# Patient Record
Sex: Male | Born: 1970 | Race: White | Hispanic: No | Marital: Married | Smoking: Never smoker
Health system: Southern US, Community
[De-identification: ages and names within clinical notes are randomized; demographics above are authoritative.]

---

## 1998-07-09 ENCOUNTER — Emergency Department (HOSPITAL_COMMUNITY): Admission: EM | Admit: 1998-07-09 | Discharge: 1998-07-10 | Payer: Self-pay | Admitting: Emergency Medicine

## 1998-07-13 ENCOUNTER — Encounter: Admission: RE | Admit: 1998-07-13 | Discharge: 1998-10-11 | Payer: Self-pay | Admitting: Internal Medicine

## 2000-11-14 ENCOUNTER — Encounter: Admission: RE | Admit: 2000-11-14 | Discharge: 2000-11-14 | Payer: Self-pay | Admitting: Occupational Medicine

## 2000-11-14 ENCOUNTER — Encounter: Payer: Self-pay | Admitting: Occupational Medicine

## 2020-10-22 ENCOUNTER — Inpatient Hospital Stay (HOSPITAL_COMMUNITY)
Admission: EM | Admit: 2020-10-22 | Discharge: 2020-10-26 | DRG: 177 | Disposition: A | Discharge status: Home / Self Care | Payer: 59 | Attending: Internal Medicine | Admitting: Internal Medicine

## 2020-10-22 ENCOUNTER — Emergency Department (HOSPITAL_COMMUNITY): Payer: 59

## 2020-10-22 DIAGNOSIS — J1282 Pneumonia due to coronavirus disease 2019: Secondary | ICD-10-CM | POA: Diagnosis not present

## 2020-10-22 DIAGNOSIS — Z6841 Body Mass Index (BMI) 40.0 and over, adult: Secondary | ICD-10-CM | POA: Diagnosis not present

## 2020-10-22 DIAGNOSIS — E860 Dehydration: Secondary | ICD-10-CM | POA: Diagnosis present

## 2020-10-22 DIAGNOSIS — R739 Hyperglycemia, unspecified: Secondary | ICD-10-CM | POA: Diagnosis present

## 2020-10-22 DIAGNOSIS — U071 COVID-19: Principal | ICD-10-CM | POA: Diagnosis present

## 2020-10-22 DIAGNOSIS — J9601 Acute respiratory failure with hypoxia: Secondary | ICD-10-CM | POA: Diagnosis present

## 2020-10-22 DIAGNOSIS — R7989 Other specified abnormal findings of blood chemistry: Secondary | ICD-10-CM | POA: Diagnosis not present

## 2020-10-22 LAB — CBC
HCT: 40.8 % (ref 39.0–52.0)
Hemoglobin: 13.6 g/dL (ref 13.0–17.0)
MCH: 31 pg (ref 26.0–34.0)
MCHC: 33.3 g/dL (ref 30.0–36.0)
MCV: 92.9 fL (ref 80.0–100.0)
Platelets: 280 K/uL (ref 150–400)
RBC: 4.39 MIL/uL (ref 4.22–5.81)
RDW: 13.3 % (ref 11.5–15.5)
WBC: 9.1 K/uL (ref 4.0–10.5)
nRBC: 0 % (ref 0.0–0.2)

## 2020-10-22 LAB — BASIC METABOLIC PANEL WITH GFR
Anion gap: 13 (ref 5–15)
BUN: 8 mg/dL (ref 6–20)
CO2: 27 mmol/L (ref 22–32)
Calcium: 8.6 mg/dL — ABNORMAL LOW (ref 8.9–10.3)
Chloride: 96 mmol/L — ABNORMAL LOW (ref 98–111)
Creatinine, Ser: 1.03 mg/dL (ref 0.61–1.24)
GFR, Estimated: >60 mL/min
Glucose, Bld: 137 mg/dL — ABNORMAL HIGH (ref 70–99)
Potassium: 4.1 mmol/L (ref 3.5–5.1)
Sodium: 136 mmol/L (ref 135–145)

## 2020-10-22 MED ORDER — PREDNISONE 20 MG PO TABS: 50.0000 mg | ORAL_TABLET | Freq: Every day | ORAL | Status: DC

## 2020-10-22 MED ORDER — SODIUM CHLORIDE 0.9 % IV SOLN
1.0000 mg/kg | Freq: Two times a day (BID) | INTRAVENOUS | Status: DC
  Filled 2020-10-22 (×2): qty 1.28

## 2020-10-22 MED ORDER — AZITHROMYCIN 250 MG PO TABS
500.0000 mg | ORAL_TABLET | Freq: Every day | ORAL | Status: AC
  Administered 2020-10-22: 22:00:00 500 mg via ORAL
  Filled 2020-10-22: qty 2

## 2020-10-22 MED ORDER — SODIUM CHLORIDE 0.9 % IV SOLN
100.0000 mg | Freq: Once | INTRAVENOUS | Status: AC
  Administered 2020-10-22: 23:00:00 100 mg via INTRAVENOUS
  Filled 2020-10-22 (×2): qty 20

## 2020-10-22 MED ORDER — IPRATROPIUM-ALBUTEROL 20-100 MCG/ACT IN AERS
1.0000 | INHALATION_SPRAY | Freq: Four times a day (QID) | RESPIRATORY_TRACT | Status: DC
  Administered 2020-10-22 – 2020-10-26 (×14): 1 via RESPIRATORY_TRACT
  Filled 2020-10-22: qty 4

## 2020-10-22 MED ORDER — ENOXAPARIN SODIUM 80 MG/0.8ML ~~LOC~~ SOLN
75.0000 mg | SUBCUTANEOUS | Status: DC
  Administered 2020-10-22 – 2020-10-25 (×4): 75 mg via SUBCUTANEOUS
  Filled 2020-10-22: qty 0.75
  Filled 2020-10-22 (×2): qty 0.8
  Filled 2020-10-22: qty 0.75
  Filled 2020-10-22: qty 0.8

## 2020-10-22 MED ORDER — METHYLPREDNISOLONE SODIUM SUCC 125 MG IJ SOLR
1.0000 mg/kg | Freq: Two times a day (BID) | INTRAMUSCULAR | Status: DC
Start: 2020-10-23 — End: 2020-10-23
  Filled 2020-10-22: qty 2

## 2020-10-22 MED ORDER — GUAIFENESIN-DM 100-10 MG/5ML PO SYRP
10.0000 mL | ORAL_SOLUTION | ORAL | Status: DC | PRN
  Administered 2020-10-22: 22:00:00 10 mL via ORAL
  Filled 2020-10-22: qty 10

## 2020-10-22 MED ORDER — SODIUM CHLORIDE 0.9 % IV SOLN: 100.0000 mg | Freq: Every day | INTRAVENOUS | Status: DC

## 2020-10-22 MED ORDER — SODIUM CHLORIDE 0.9 % IV SOLN
100.0000 mg | Freq: Once | INTRAVENOUS | Status: AC
  Administered 2020-10-23: 03:00:00 100 mg via INTRAVENOUS
  Filled 2020-10-22 (×2): qty 20

## 2020-10-22 MED ORDER — ZINC SULFATE 220 (50 ZN) MG PO CAPS
220.0000 mg | ORAL_CAPSULE | Freq: Every day | ORAL | Status: DC
  Administered 2020-10-22 – 2020-10-26 (×5): 220 mg via ORAL
  Filled 2020-10-22 (×6): qty 1

## 2020-10-22 MED ORDER — ACETAMINOPHEN 325 MG PO TABS: 650.0000 mg | ORAL_TABLET | Freq: Four times a day (QID) | ORAL | Status: DC | PRN

## 2020-10-22 MED ORDER — BARICITINIB 2 MG PO TABS
4.0000 mg | ORAL_TABLET | Freq: Every day | ORAL | Status: DC
  Administered 2020-10-22 – 2020-10-26 (×5): 4 mg via ORAL
  Filled 2020-10-22 (×6): qty 2

## 2020-10-22 MED ORDER — VITAMIN D 25 MCG (1000 UNIT) PO TABS
1000.0000 [IU] | ORAL_TABLET | Freq: Every day | ORAL | Status: DC
  Administered 2020-10-23 – 2020-10-26 (×5): 1000 [IU] via ORAL
  Filled 2020-10-22 (×5): qty 1

## 2020-10-22 MED ORDER — SODIUM CHLORIDE 0.9 % IV SOLN
100.0000 mg | Freq: Every day | INTRAVENOUS | Status: AC
  Administered 2020-10-23 – 2020-10-26 (×4): 100 mg via INTRAVENOUS
  Filled 2020-10-22 (×6): qty 20

## 2020-10-22 MED ORDER — INSULIN ASPART 100 UNIT/ML ~~LOC~~ SOLN
0.0000 [IU] | Freq: Three times a day (TID) | SUBCUTANEOUS | Status: DC
  Administered 2020-10-23: 11:00:00 1 [IU] via SUBCUTANEOUS

## 2020-10-22 MED ORDER — SODIUM CHLORIDE 0.9 % IV SOLN
200.0000 mg | Freq: Once | INTRAVENOUS | Status: DC
  Filled 2020-10-22: qty 40

## 2020-10-22 MED ORDER — ONDANSETRON HCL 4 MG/2ML IJ SOLN
4.0000 mg | Freq: Four times a day (QID) | INTRAMUSCULAR | Status: DC | PRN
  Administered 2020-10-22: 23:00:00 4 mg via INTRAVENOUS
  Filled 2020-10-22: qty 2

## 2020-10-22 MED ORDER — ADULT MULTIVITAMIN W/MINERALS CH
1.0000 | ORAL_TABLET | Freq: Every day | ORAL | Status: DC
  Administered 2020-10-22 – 2020-10-26 (×5): 1 via ORAL
  Filled 2020-10-22 (×5): qty 1

## 2020-10-22 MED ORDER — AZITHROMYCIN 250 MG PO TABS: 250.0000 mg | ORAL_TABLET | Freq: Every day | ORAL | Status: DC

## 2020-10-22 MED ORDER — ASCORBIC ACID 500 MG PO TABS
500.0000 mg | ORAL_TABLET | Freq: Every day | ORAL | Status: DC
  Administered 2020-10-23: 02:00:00 500 mg via ORAL
  Filled 2020-10-22: qty 1

## 2020-10-22 NOTE — ED Triage Notes (Signed)
Pt with increased shob X3 days. Dx with covid 10 days ago. Pt oxygen 88% on RA, 95% 3L.

## 2020-10-22 NOTE — ED Provider Notes (Signed)
[facility] EMERGENCY DEPARTMENT Provider Note   CSN: [id number] Arrival date & time: 10/22/20  1120     History Chief Complaint  Patient presents with  . Shortness of Breath    Troy Diaz is a 49 y.o. male.  HPI Patient is a 49 year old male with no pertinent medical history who presents to ED with complaint of shortness of breath.  Patient states that since approximately 10 days ago, he has been feeling sick with cough and subjective fevers and fatigue .  He was diagnosed with COVID-19 around that time after his daughter had an exposure to a friend with the virus. He states that multiple family members have been sick but are now feeling better.  However, over the last several days, patient has been feeling worse with increasing shortness of breath.    No past medical history on file.  Patient Active Problem List   Diagnosis Date Noted  . Pneumonia due to COVID-19 virus 10/22/2020     No family history on file.     Home Medications Prior to Admission medications   Not on File    Allergies    Patient has no known allergies.  Review of Systems   Review of Systems  Constitutional: Positive for fatigue and fever.  HENT: Negative for ear discharge and ear pain.   Eyes: Negative for pain and visual disturbance.  Respiratory: Positive for cough (Productive) and shortness of breath. Negative for wheezing.   Cardiovascular: Negative for chest pain and leg swelling.  Gastrointestinal: Positive for diarrhea. Negative for abdominal pain and vomiting.  Genitourinary: Negative for dysuria and hematuria.  Musculoskeletal: Negative for gait problem and joint swelling.  Skin: Negative for color change and rash.  Neurological: Negative for syncope and headaches.  Psychiatric/Behavioral: Negative for confusion and suicidal ideas.  All other systems reviewed and are negative.   Physical Exam Updated Vital Signs BP 127/76 (BP Location: Right Arm)   Pulse 95    Temp 99.1 F (37.3 C) (Oral)   Resp 18   Ht 5\' 10"  (1.778 m)   Wt (!) 158.8 kg   SpO2 95%   BMI 50.22 kg/m   Physical Exam Vitals and nursing note reviewed.  Constitutional:      Appearance: He is well-developed and well-nourished. He is obese. He is ill-appearing. He is not toxic-appearing.  HENT:     Head: Normocephalic and atraumatic.     Mouth/Throat:     Mouth: Mucous membranes are moist.  Eyes:     General: No scleral icterus.    Conjunctiva/sclera: Conjunctivae normal.  Cardiovascular:     Rate and Rhythm: Regular rhythm. Tachycardia present.     Pulses: Normal pulses.     Heart sounds: No murmur heard.   Pulmonary:     Effort: Respiratory distress present.     Breath sounds: Rales present.     Comments: Increased respiratory effort, satting high 80s on room air at rest Abdominal:     Palpations: Abdomen is soft.     Tenderness: There is no abdominal tenderness. There is no guarding or rebound.  Musculoskeletal:        General: No edema.     Cervical back: Neck supple.  Skin:    General: Skin is warm and dry.  Neurological:     Mental Status: He is alert.     Comments: Alert, grossly oriented, moves all extremities spontaneously, normal gait  Psychiatric:        Mood and  Affect: Mood and affect normal.        Behavior: Behavior normal.     ED Results / Procedures / Treatments   Labs (all labs ordered are listed, but only abnormal results are displayed) Labs Reviewed  RESP PANEL BY RT-PCR (FLU A&B, COVID) ARPGX2 - Abnormal; Notable for the following components:      Result Value   SARS Coronavirus 2 by RT PCR POSITIVE (*)    All other components within normal limits  BASIC METABOLIC PANEL - Abnormal; Notable for the following components:   Chloride 96 (*)    Glucose, Bld 137 (*)    Calcium 8.6 (*)    All other components within normal limits  CBC  HIV ANTIBODY (ROUTINE TESTING W REFLEX)  CBC WITH DIFFERENTIAL/PLATELET  COMPREHENSIVE METABOLIC  PANEL  C-REACTIVE PROTEIN  D-DIMER, QUANTITATIVE (NOT AT [facility])  FERRITIN  MAGNESIUM  PHOSPHORUS  PROCALCITONIN    EKG None  Radiology DG Chest Portable 1 View  Result Date: 10/22/2020 CLINICAL DATA:  COVID positive.  Short of breath EXAM: PORTABLE CHEST 1 VIEW COMPARISON:  None. FINDINGS: Diffuse bilateral patchy infiltrates throughout both lungs predominately in the bases. No effusion. Heart size mildly enlarged. No vascular congestion. IMPRESSION: Extensive bibasilar airspace disease compatible with COVID pneumonia. Electronically Signed   By: Marlan Palau M.D.   On: 10/22/2020 12:32    Procedures Procedures (including critical care time)  Medications Ordered in ED Medications  baricitinib (OLUMIANT) tablet 4 mg (has no administration in time range)  Ipratropium-Albuterol (COMBIVENT) respimat 1 puff (has no administration in time range)  guaiFENesin-dextromethorphan (ROBITUSSIN DM) 100-10 MG/5ML syrup 10 mL (has no administration in time range)  insulin aspart (novoLOG) injection 0-9 Units (has no administration in time range)  ascorbic acid (VITAMIN C) tablet 500 mg (has no administration in time range)  zinc sulfate capsule 220 mg (has no administration in time range)  multivitamin with minerals tablet 1 tablet (has no administration in time range)  cholecalciferol (VITAMIN D3) tablet 1,000 Units (has no administration in time range)  methylPREDNISolone sodium succinate (SOLU-MEDROL) 125 mg/2 mL injection 107.5 mg (has no administration in time range)    Followed by  predniSONE (DELTASONE) tablet 50 mg (has no administration in time range)  remdesivir 100 mg in sodium chloride 0.9 % 100 mL IVPB (has no administration in time range)    Followed by  remdesivir 100 mg in sodium chloride 0.9 % 100 mL IVPB (has no administration in time range)    Followed by  remdesivir 100 mg in sodium chloride 0.9 % 100 mL IVPB (has no administration in time range)  enoxaparin (LOVENOX)  injection 75 mg (has no administration in time range)    ED Course  I have reviewed the triage vital signs and the nursing notes.  Pertinent labs & imaging results that were available during my care of the patient were reviewed by me and considered in my medical decision making (see chart for details).    MDM Rules/Calculators/A&P                         49 y/o male with shortness of breath, cough. I placed him on 2L [place] upon initial interview as his room air saturations were in high 80s at rest, declining further with ambulation. I am most concerned for COVID-19 pneumonia with new O2 requirement. Differential includes pleural effusion or less likely pneumothorax, new heart failure.  Labs reviewed and notable for positive  SARS-CoV-2 with negative flu A/B.  BC WNL.  Unremarkable metabolic panel. ECG reviewed and shows sinus tachycardia with a rate of 108.  PR 152, QRS 82, QTc 423.  Normal axis.  There is no STEMI or evidence of acute ischemia/infarct. Imaging reviewed and notable for opacities concerning for multifocal pneumonia c/w history SARS-CoV-2 infection.  No vascular congestion or pleural effusion noted.  Pt will be admitted to the Internal Medicine service for further evaluation and management given new O2 requirement. Pt has remained in stable condition throughout ED course.  Final Clinical Impression(s) / ED Diagnoses Final diagnoses:  Pneumonia due to COVID-19 virus     Corliss Blacker, MD 10/23/20 5885    Blane Ohara, MD 10/25/20 1242

## 2020-10-22 NOTE — H&P (Addendum)
History and Physical  Troy Diaz MBW:[id number] DOB: 03/11/1971 DOA: 10/22/2020  Referring physician: Aileen Pilot, PA, EDP  PCP: Pcp, No  Outpatient Specialists: None Patient coming from: Home  Chief Complaint: Worsening shortness of breath and fatigue after being diagnosed with Covid-19 viral infection on 10/12/20 at [facility].  HPI: Troy Diaz is a 49 y.o. male with medical history significant for severe morbid obesity, not on any medication who presented to [facility] ED due to gradually worsening shortness of breath and fatigue x3 days.  Associated with a productive cough of greenish sputum.  Non smoker.  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  He received his first dose of Pfizzer vaccine on 10/06/20 at outside facility.  He thinks he contracted Covid-19 from his daughter who was exposed by her boyfriend, boyfriend had exposure at work.  His daughter lives with him along with other family members.  He denies any GI symptoms.  Has some chest discomfort all across when he coughs.  Endorses loss of smell but not of taste.  Has had a loss of appetite.  Presents today due to worsening dyspnea at rest and generalized fatigue.  TR[facility] was asked to admit.    In-house covid-19 screening test is pending at the time of this dictation.  Follow results to confirm.  Patient has consented to receive Remdesivir and Baricitinib.  ED Course: T-max 99.5, BP 127/76, heart rate 95, respiration rate 18, O2 saturation 95% on 3 L, presented with O2 saturation of 89% on room air.  Lab studies essentially unremarkable except for elevated serum glucose 137.  Chest x-ray, personally reviewed showing bilateral multifocal pulmonary infiltrates consistent with COVID-19 viral pneumonia.  Review of Systems: Review of systems as noted in the HPI. All other systems reviewed and are negative.  Past medical history: Severe morbid obesity   Social History:  Denies  tobacco use. Occasional beer use, 1 beer per month or every 2 months. No illicit drug use  No Known Allergies  Family history: Maternal grandfather deceased from complication of CHF.   Mother with history of hypertension.  Prior to Admission medications   Not on File    Physical Exam: BP 127/76 (BP Location: Right Arm)   Pulse 95   Temp 99.1 F (37.3 C) (Oral)   Resp 18   Ht 5\' 10"  (1.778 m)   Wt (!) 158.8 kg   SpO2 95%   BMI 50.22 kg/m   . General: 49 y.o. year-old male severe morbid obesity in no acute distress.  Alert and oriented x3. . Cardiovascular: Regular rate and rhythm with no rubs or gallops.  No thyromegaly or JVD noted.  Trace lower extremity edema. 2/4 pulses in all 4 extremities. 54 Respiratory: Diffused rales bilaterally with no wheezes or rales. Good inspiratory effort. . Abdomen: Soft obese nontender nondistended with normal bowel sounds x4 quadrants. . Muskuloskeletal: No cyanosis or clubbing.  Trace edema noted bilaterally . Neuro: CN II-XII intact, strength, sensation, reflexes . Skin: No ulcerative lesions noted or rashes . Psychiatry: Judgement and insight appear normal. Mood is appropriate for condition and setting          Labs on Admission:  Basic Metabolic Panel: Recent Labs  Lab 10/22/20 1209  NA 136  K 4.1  CL 96*  CO2 27  GLUCOSE 137*  BUN 8  CREATININE 1.03  CALCIUM 8.6*   Liver Function Tests: No results for input(s): AST, ALT, ALKPHOS,  BILITOT, PROT, ALBUMIN in the last 168 hours. No results for input(s): LIPASE, AMYLASE in the last 168 hours. No results for input(s): AMMONIA in the last 168 hours. CBC: Recent Labs  Lab 10/22/20 1209  WBC 9.1  HGB 13.6  HCT 40.8  MCV 92.9  PLT 280   Cardiac Enzymes: No results for input(s): CKTOTAL, CKMB, CKMBINDEX, TROPONINI in the last 168 hours.  BNP (last 3 results) No results for input(s): BNP in the last 8760 hours.  ProBNP (last 3 results) No results for input(s): PROBNP in  the last 8760 hours.  CBG: No results for input(s): GLUCAP in the last 168 hours.  Radiological Exams on Admission: DG Chest Portable 1 View  Result Date: 10/22/2020 CLINICAL DATA:  COVID positive.  Short of breath EXAM: PORTABLE CHEST 1 VIEW COMPARISON:  None. FINDINGS: Diffuse bilateral patchy infiltrates throughout both lungs predominately in the bases. No effusion. Heart size mildly enlarged. No vascular congestion. IMPRESSION: Extensive bibasilar airspace disease compatible with COVID pneumonia. Electronically Signed   By: Marlan Palau M.D.   On: 10/22/2020 12:32    EKG: I independently viewed the EKG done and my findings are as followed: Sinus tachycardia 108 with no specific ST-T changes.  Assessment/Plan Present on Admission: . Pneumonia due to COVID-19 virus  Active Problems:   Pneumonia due to COVID-19 virus   COVID-19 viral pneumonia, POA Presented with gradually worsening shortness of breath and generalized fatigue Received his first dose of Pfizzer vaccine on 10/06/20 at outside facility.  Self-reported positive COVID-19 screening test on 10/12/20 with antigen and PCR test at outside facility. Follow in-house COVID-19 screening test ordered on 10/22/20 to confirm. Patient consented to the use of Remdesivir and Baricitinib Personally reviewed chest x-ray done on admission which showed bilateral multifocal pulmonary infiltrates consistent with COVID-19 viral pneumonia Start IV remdesivir x5 days, p.o. baricitinib, high-dose IV Solu-Medrol twice daily, bronchodilators scheduled every 6 hours Start incentive spirometer and flutter valve, antitussives Pronate or side sleeping as tolerated Mobilize as tolerated Vitamin C, D3 and zinc DVT prophylaxis with subcu Lovenox daily Airborne/contact precautions Endorses greenish sputum for the last 3 days Obtain Procalcitonin level and start Zpack, complete 5 days  Acute hypoxic respiratory failure secondary to COVID-19 viral  pneumonia, POA Presented with O2 saturation of 89% on room air Not on oxygen supplementation at baseline Maintain O2 saturation greater than 92% Currently requiring 3 L to maintain O2 saturation above 90%. Wean off oxygen supplementation as tolerated  Generalized fatigue secondary to COVID-19 viral infection Mobilize as tolerated Increase oral protein calorie intake OOB to chair every shift Fall precautions  Hyperglycemia, no prior history of diabetes Will likely be exacerbated by high doses of IV steroids Presented with serum glucose of 137 Obtain hemoglobin A1c Start insulin sliding scale  Severe morbid obesity BMI 50 Recommend weight loss outpatient with healthy dieting and regular physical activity Will need to follow-up with PCP regarding this   DVT prophylaxis: Subcu Lovenox daily  Code Status: Full code as stated by the patient himself.  Family Communication: None at bedside  Disposition Plan: Admit to MedSurg unit with remote telemetry  Consults called: None.  Admission status: Inpatient status.  Patient will require at least 2 midnight for further evaluation and treatment of present condition.   Status is: Inpatient    Dispo:  Patient From: Home  Planned Disposition: Home  Expected discharge date: 10/25/2020  Medically stable for discharge: No, ongoing management of COVID-19 viral pneumonia and acute hypoxic respiratory failure.  Darlin Drop MD [facility] Pager [phone]  If 7PM-7AM, please contact night-coverage [email or web address] Password [facility]  10/22/2020, 7:56 PM

## 2020-10-23 ENCOUNTER — Other Ambulatory Visit: Payer: Self-pay

## 2020-10-23 ENCOUNTER — Encounter (HOSPITAL_COMMUNITY): Payer: Self-pay | Admitting: Internal Medicine

## 2020-10-23 LAB — CBC WITH DIFFERENTIAL/PLATELET
Abs Immature Granulocytes: 0.05 10*3/uL (ref 0.00–0.07)
Basophils Absolute: 0 10*3/uL (ref 0.0–0.1)
Basophils Relative: 0 %
Eosinophils Absolute: 0 10*3/uL (ref 0.0–0.5)
Eosinophils Relative: 1 %
HCT: 41.6 % (ref 39.0–52.0)
Hemoglobin: 13.1 g/dL (ref 13.0–17.0)
Immature Granulocytes: 1 %
Lymphocytes Relative: 36 %
Lymphs Abs: 2.9 10*3/uL (ref 0.7–4.0)
MCH: 29.9 pg (ref 26.0–34.0)
MCHC: 31.5 g/dL (ref 30.0–36.0)
MCV: 95 fL (ref 80.0–100.0)
Monocytes Absolute: 0.6 10*3/uL (ref 0.1–1.0)
Monocytes Relative: 7 %
Neutro Abs: 4.5 10*3/uL (ref 1.7–7.7)
Neutrophils Relative %: 55 %
Platelets: 309 10*3/uL (ref 150–400)
RBC: 4.38 MIL/uL (ref 4.22–5.81)
RDW: 13.3 % (ref 11.5–15.5)
WBC: 8.1 10*3/uL (ref 4.0–10.5)
nRBC: 0 % (ref 0.0–0.2)

## 2020-10-23 LAB — C-REACTIVE PROTEIN: CRP: 24.2 mg/dL — ABNORMAL HIGH (ref <1.0)

## 2020-10-23 LAB — CBG MONITORING, ED
Glucose-Capillary: 137 mg/dL — ABNORMAL HIGH (ref 70–99)
Glucose-Capillary: 121 mg/dL — ABNORMAL HIGH (ref 70–99)
Glucose-Capillary: 179 mg/dL — ABNORMAL HIGH (ref 70–99)

## 2020-10-23 LAB — COMPREHENSIVE METABOLIC PANEL
ALT: 37 U/L (ref 0–44)
AST: 49 U/L — ABNORMAL HIGH (ref 15–41)
Albumin: 2.7 g/dL — ABNORMAL LOW (ref 3.5–5.0)
Alkaline Phosphatase: 46 U/L (ref 38–126)
Anion gap: 13 (ref 5–15)
BUN: 10 mg/dL (ref 6–20)
CO2: 26 mmol/L (ref 22–32)
Calcium: 8.7 mg/dL — ABNORMAL LOW (ref 8.9–10.3)
Chloride: 96 mmol/L — ABNORMAL LOW (ref 98–111)
Creatinine, Ser: 0.94 mg/dL (ref 0.61–1.24)
GFR, Estimated: >60 mL/min (ref >60)
Glucose, Bld: 139 mg/dL — ABNORMAL HIGH (ref 70–99)
Potassium: 3.5 mmol/L (ref 3.5–5.1)
Sodium: 135 mmol/L (ref 135–145)
Total Bilirubin: 0.6 mg/dL (ref 0.3–1.2)
Total Protein: 7.3 g/dL (ref 6.5–8.1)

## 2020-10-23 LAB — RESP PANEL BY RT-PCR (FLU A&B, COVID) ARPGX2
Influenza A by PCR: NEGATIVE
Influenza B by PCR: NEGATIVE
SARS Coronavirus 2 by RT PCR: POSITIVE — AB

## 2020-10-23 LAB — D-DIMER, QUANTITATIVE: D-Dimer, Quant: 3.6 ug/mL-FEU — ABNORMAL HIGH (ref 0.00–0.50)

## 2020-10-23 LAB — GLUCOSE, CAPILLARY: Glucose-Capillary: 157 mg/dL — ABNORMAL HIGH (ref 70–99)

## 2020-10-23 LAB — HIV ANTIBODY (ROUTINE TESTING W REFLEX): HIV Screen 4th Generation wRfx: NONREACTIVE

## 2020-10-23 LAB — FERRITIN: Ferritin: 1110 ng/mL — ABNORMAL HIGH (ref 24–336)

## 2020-10-23 LAB — HEMOGLOBIN A1C
Hgb A1c MFr Bld: 6.6 % — ABNORMAL HIGH (ref 4.8–5.6)
Mean Plasma Glucose: 142.72 mg/dL

## 2020-10-23 LAB — PROCALCITONIN: Procalcitonin: 0.13 ng/mL

## 2020-10-23 LAB — BRAIN NATRIURETIC PEPTIDE: B Natriuretic Peptide: 16.9 pg/mL (ref 0.0–100.0)

## 2020-10-23 LAB — MAGNESIUM: Magnesium: 2.5 mg/dL — ABNORMAL HIGH (ref 1.7–2.4)

## 2020-10-23 LAB — PHOSPHORUS: Phosphorus: 3.7 mg/dL (ref 2.5–4.6)

## 2020-10-23 MED ORDER — PANTOPRAZOLE SODIUM 40 MG PO TBEC
40.0000 mg | DELAYED_RELEASE_TABLET | Freq: Every day | ORAL | Status: DC
  Administered 2020-10-23 – 2020-10-26 (×4): 40 mg via ORAL
  Filled 2020-10-23 (×5): qty 1

## 2020-10-23 MED ORDER — POTASSIUM CHLORIDE CRYS ER 20 MEQ PO TBCR
40.0000 meq | EXTENDED_RELEASE_TABLET | Freq: Once | ORAL | Status: AC
  Administered 2020-10-23: 11:00:00 40 meq via ORAL
  Filled 2020-10-23: qty 2

## 2020-10-23 MED ORDER — LACTATED RINGERS IV SOLN: INTRAVENOUS | Status: AC

## 2020-10-23 MED ORDER — INSULIN GLARGINE 100 UNIT/ML ~~LOC~~ SOLN
20.0000 [IU] | Freq: Every day | SUBCUTANEOUS | Status: DC
  Administered 2020-10-23 – 2020-10-26 (×4): 20 [IU] via SUBCUTANEOUS
  Filled 2020-10-23 (×4): qty 0.2

## 2020-10-23 MED ORDER — INSULIN ASPART 100 UNIT/ML ~~LOC~~ SOLN
0.0000 [IU] | Freq: Three times a day (TID) | SUBCUTANEOUS | Status: DC
  Administered 2020-10-23 – 2020-10-24 (×2): 3 [IU] via SUBCUTANEOUS
  Administered 2020-10-24: 17:00:00 5 [IU] via SUBCUTANEOUS
  Administered 2020-10-24 – 2020-10-25 (×3): 3 [IU] via SUBCUTANEOUS
  Administered 2020-10-25 – 2020-10-26 (×2): 5 [IU] via SUBCUTANEOUS
  Administered 2020-10-26: 08:00:00 3 [IU] via SUBCUTANEOUS

## 2020-10-23 MED ORDER — INSULIN ASPART 100 UNIT/ML ~~LOC~~ SOLN: 0.0000 [IU] | Freq: Every day | SUBCUTANEOUS | Status: DC

## 2020-10-23 MED ORDER — METHYLPREDNISOLONE SODIUM SUCC 125 MG IJ SOLR
60.0000 mg | Freq: Two times a day (BID) | INTRAMUSCULAR | Status: DC
  Administered 2020-10-23 – 2020-10-26 (×7): 60 mg via INTRAVENOUS
  Filled 2020-10-23 (×7): qty 2

## 2020-10-23 NOTE — ED Notes (Signed)
Dinner Tray Ordered @ 1700. 

## 2020-10-23 NOTE — Progress Notes (Signed)
PROGRESS NOTE                                                                                                                                                                                                             Patient Demographics:    Troy Diaz, is a 49 y.o. male, DOB - 1971-03-28, XBJ:[id number]  Outpatient Primary MD for the patient is Pcp, No    LOS - 1  Admit date - 10/22/2020    Chief Complaint  Patient presents with  . Shortness of Breath       Brief Narrative (HPI from H&P)  - Troy Diaz is a 49 y.o. male with medical history significant for severe morbid obesity, not on any medication who presented to [facility] ED due to gradually worsening shortness of breath and fatigue x3 days.  Associated with a productive cough of greenish sputum.  Non smoker.  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   Subjective:    Troy Diaz today has, No headache, No chest pain, No abdominal pain - No Nausea, No new weakness tingling or numbness,+ve SOB.   Assessment  & Plan :     1. Acute Hypoxic Resp. Failure due to Acute Covid 19 Viral Pneumonitis during the ongoing 2020 Covid 19 Pandemic - he fortunately not vaccinated and so far seems to have incurred moderate parenchymal lung injury due to COVID-19 pneumonia, has been appropriately placed on steroids and Remdesivir.  Will monitor.  He has consented for Actemra use if he gets worse.  Encouraged the patient to sit up in chair in the daytime use I-S and flutter valve for pulmonary toiletry and then prone in bed when at night.  Will advance activity and titrate down oxygen as possible.  Actemra/Baricitinib  off label use - patient was told that if COVID-19 pneumonitis gets worse we might  potentially use Actemra off label, patient denies any known history of active diverticulitis, tuberculosis or hepatitis, understands the risks and benefits and wants to proceed with Actemra treatment if required.   SpO2: 91 % O2 Flow Rate (L/min): 3 L/min  Recent Labs  Lab 10/22/20 1209 10/22/20 2244 10/23/20  0629  WBC 9.1  --  8.1  HGB 13.6  --  13.1  HCT 40.8  --  41.6  PLT 280  --  309  CRP  --   --  24.2*  DDIMER  --   --  3.60*  PROCALCITON  --   --  0.13  AST  --   --  49*  ALT  --   --  37  ALKPHOS  --   --  46  BILITOT  --   --  0.6  ALBUMIN  --   --  2.7*  SARSCOV2NAA  --  POSITIVE*  --    No results found for: HGBA1C   2.  Morbid obesity.  BMI 50.  Follow with PCP.  3. Weakness - IVF, PT-OT  4. DM 2 ? - check A1c, Lantus + ISS, may need education.   CBG (last 3)  No results for input(s): GLUCAP in the last 72 hours.    Condition - Extremely Guarded  Family Communication  :  None listed  Code Status :  Full  Consults  :  None  Procedures  :    PUD Prophylaxis : PPI  Disposition Plan  :    Status is: Inpatient  Remains inpatient appropriate because:IV treatments appropriate due to intensity of illness or inability to take PO   Dispo:  Patient From: Home  Planned Disposition: Home  Expected discharge date: 10/25/2020  Medically stable for discharge: No   DVT Prophylaxis  :  Lovenox   Lab Results  Component Value Date   PLT 309 10/23/2020    Diet :  Diet Order            Diet regular Room service appropriate? Yes; Fluid consistency: Thin  Diet effective now                  Inpatient Medications  Scheduled Meds: . baricitinib  4 mg Oral Daily  . cholecalciferol  1,000 Units Oral Daily  . enoxaparin (LOVENOX) injection  75 mg Subcutaneous Q24H  . insulin aspart  0-15 Units Subcutaneous TID WC  . insulin aspart  0-5 Units Subcutaneous QHS  . insulin glargine  20 Units Subcutaneous Daily  . Ipratropium-Albuterol  1 puff  Inhalation Q6H  . methylPREDNISolone (SOLU-MEDROL) injection  60 mg Intravenous Q12H  . multivitamin with minerals  1 tablet Oral Daily  . zinc sulfate  220 mg Oral Daily   Continuous Infusions: . remdesivir 100 mg in NS 100 mL     PRN Meds:.acetaminophen, guaiFENesin-dextromethorphan, ondansetron (ZOFRAN) IV  Antibiotics  :    Anti-infectives (From admission, onward)   Start     Dose/Rate Route Frequency Ordered Stop   10/23/20 1000  remdesivir 100 mg in sodium chloride 0.9 % 100 mL IVPB  Status:  Discontinued       "Followed by" Linked Group Details   100 mg 200 mL/hr over 30 Minutes Intravenous Daily 10/22/20 1956 10/22/20 2002   10/23/20 1000  remdesivir 100 mg in sodium chloride 0.9 % 100 mL IVPB       "Followed by" Linked Group Details   100 mg 200 mL/hr over 30 Minutes Intravenous Daily 10/22/20 2002 10/27/20 0959   10/23/20 1000  azithromycin (ZITHROMAX) tablet 250 mg  Status:  Discontinued       "Followed by" Linked Group Details   250 mg Oral Daily 10/22/20 2050 10/23/20 0916   10/22/20 2100  azithromycin (ZITHROMAX) tablet 500 mg       "  Followed by" Linked Group Details   500 mg Oral Daily 10/22/20 2050 10/22/20 2220   10/22/20 2045  remdesivir 100 mg in sodium chloride 0.9 % 100 mL IVPB       "Followed by" Linked Group Details   100 mg 200 mL/hr over 30 Minutes Intravenous  Once 10/22/20 2002 10/23/20 0308   10/22/20 2015  remdesivir 100 mg in sodium chloride 0.9 % 100 mL IVPB       "Followed by" Linked Group Details   100 mg 200 mL/hr over 30 Minutes Intravenous  Once 10/22/20 2002 10/23/20 0131   10/22/20 2000  remdesivir 200 mg in sodium chloride 0.9% 250 mL IVPB  Status:  Discontinued       "Followed by" Linked Group Details   200 mg 580 mL/hr over 30 Minutes Intravenous Once 10/22/20 1956 10/22/20 2002       Time Spent in minutes  30   Troy Diaz M.D on 10/23/2020 at 9:17 AM  To page go to [email or web address]   [facility] -  Office   [phone]    See all Orders from today for further details    Objective:   Vitals:   10/23/20 0200 10/23/20 0340 10/23/20 0600 10/23/20 0630  BP: 131/80 122/76 126/74 138/88  Pulse: 97 99 100 (!) 103  Resp: (!) 9 20 14  (!) 24  Temp:  97.9 F (36.6 C)    TempSrc:  Oral    SpO2: 92% 94% 92% 91%  Weight:      Height:        Wt Readings from Last 3 Encounters:  10/22/20 (!) 158.8 kg    No intake or output data in the 24 hours ending 10/23/20 0917   Physical Exam  Awake Alert, No new F.N deficits, Normal affect [place].AT,PERRAL Supple Neck,No JVD, No cervical lymphadenopathy appriciated.  Symmetrical Chest wall movement, Good air movement bilaterally, CTAB RRR,No Gallops,Rubs or new Murmurs, No Parasternal Heave +ve B.Sounds, Abd Soft, No tenderness, No organomegaly appriciated, No rebound - guarding or rigidity. No Cyanosis, Clubbing or edema, No new Rash or bruise       Data Review:    CBC Recent Labs  Lab 10/22/20 1209 10/23/20 0629  WBC 9.1 8.1  HGB 13.6 13.1  HCT 40.8 41.6  PLT 280 309  MCV 92.9 95.0  MCH 31.0 29.9  MCHC 33.3 31.5  RDW 13.3 13.3  LYMPHSABS  --  2.9  MONOABS  --  0.6  EOSABS  --  0.0  BASOSABS  --  0.0    Recent Labs  Lab 10/22/20 1209 10/23/20 0629  NA 136 135  K 4.1 3.5  CL 96* 96*  CO2 27 26  GLUCOSE 137* 139*  BUN 8 10  CREATININE 1.03 0.94  CALCIUM 8.6* 8.7*  AST  --  49*  ALT  --  37  ALKPHOS  --  46  BILITOT  --  0.6  ALBUMIN  --  2.7*  MG  --  2.5*  CRP  --  24.2*  DDIMER  --  3.60*  PROCALCITON  --  0.13    ------------------------------------------------------------------------------------------------------------------ No results for input(s): CHOL, HDL, LDLCALC, TRIG, CHOLHDL, LDLDIRECT in the last 72 hours.  No results found for: HGBA1C ------------------------------------------------------------------------------------------------------------------ No results for input(s): TSH, T4TOTAL, T3FREE,  THYROIDAB in the last 72 hours.  Invalid input(s): FREET3  Cardiac Enzymes No results for input(s): CKMB, TROPONINI, MYOGLOBIN in the last 168 hours.  Invalid input(s): CK ------------------------------------------------------------------------------------------------------------------ No results found for:  BNP  Micro Results Recent Results (from the past 240 hour(s))  Resp Panel by RT-PCR (Flu A&B, Covid) Nasopharyngeal Swab     Status: Abnormal   Collection Time: 10/22/20 10:44 PM   Specimen: Nasopharyngeal Swab; Nasopharyngeal(NP) swabs in vial transport medium  Result Value Ref Range Status   SARS Coronavirus 2 by RT PCR POSITIVE (A) NEGATIVE Final    Comment: RESULT CALLED TO, READ BACK BY AND VERIFIED WITH: Ailene Ravel, A RN 10/23/20 at 0009 sk  (NOTE) SARS-CoV-2 target nucleic acids are DETECTED.  The SARS-CoV-2 RNA is generally detectable in upper respiratory specimens during the acute phase of infection. Positive results are indicative of the presence of the identified virus, but do not rule out bacterial infection or co-infection with other pathogens not detected by the test. Clinical correlation with patient history and other diagnostic information is necessary to determine patient infection status. The expected result is Negative.  Fact Sheet for Patients: BloggerCourse.com  Fact Sheet for Healthcare Providers: SeriousBroker.it  This test is not yet approved or cleared by the [place] FDA and  has been authorized for detection and/or diagnosis of SARS-CoV-2 by FDA under an Emergency Use Authorization (EUA).  This EUA will remain in effect (meaning this  test can be used) for the duration of  the COVID-19 declaration under Section 564(b)(1) of the Act, 21 U.S.C. section 360bbb-3(b)(1), unless the authorization is terminated or revoked sooner.     Influenza A by PCR NEGATIVE NEGATIVE Final    Influenza B by PCR NEGATIVE NEGATIVE Final    Comment: (NOTE) The Xpert Xpress SARS-CoV-2/FLU/RSV plus assay is intended as an aid in the diagnosis of influenza from Nasopharyngeal swab specimens and should not be used as a sole basis for treatment. Nasal washings and aspirates are unacceptable for Xpert Xpress SARS-CoV-2/FLU/RSV testing.  Fact Sheet for Patients: BloggerCourse.com  Fact Sheet for Healthcare Providers: SeriousBroker.it  This test is not yet approved or cleared by the [place] FDA and has been authorized for detection and/or diagnosis of SARS-CoV-2 by FDA under an Emergency Use Authorization (EUA). This EUA will remain in effect (meaning this test can be used) for the duration of the COVID-19 declaration under Section 564(b)(1) of the Act, 21 U.S.C. section 360bbb-3(b)(1), unless the authorization is terminated or revoked.  Performed at [facility], [address] [place], [place] [address]     Radiology Reports DG Chest Portable 1 View  Result Date: 10/22/2020 CLINICAL DATA:  COVID positive.  Short of breath EXAM: PORTABLE CHEST 1 VIEW COMPARISON:  None. FINDINGS: Diffuse bilateral patchy infiltrates throughout both lungs predominately in the bases. No effusion. Heart size mildly enlarged. No vascular congestion. IMPRESSION: Extensive bibasilar airspace disease compatible with COVID pneumonia. Electronically Signed   By: Marlan Palau M.D.   On: 10/22/2020 12:32

## 2020-10-23 NOTE — ED Notes (Signed)
+  MS Breakfast Order placed 

## 2020-10-23 NOTE — TOC Initial Note (Signed)
Transition of Care [facility]) - Initial/Assessment Note    Patient Details  Name: Troy Diaz MRN: [id number] Date of Birth: 1971-01-08  [facility] CM/SW Contact:    Lockie Pares, RN Phone Number: 10/23/2020, 10:53 AM  Clinical Narrative:                 COVID diagnosed 12/7 progressive SHOB, Works in Healthcare acute resp Failure, unvaccinated. Employed with [employer]. Oxygen at 3 LPM , May need Home oxygen, CM will follow for needs.   Expected Discharge Plan: Home/Self Care Barriers to Discharge: Continued Medical Work up   Patient Goals and CMS Choice        Expected Discharge Plan and Services Expected Discharge Plan: Home/Self Care   Discharge Planning Services: CM Consult   Living arrangements for the past 2 months: Single Family Home                                      Prior Living Arrangements/Services Living arrangements for the past 2 months: Single Family Home   Patient language and need for interpreter reviewed:: Yes        Need for Family Participation in Patient Care: Yes (Comment) Care giver support system in place?: Yes (comment)   Criminal Activity/Legal Involvement Pertinent to Current Situation/Hospitalization: No - Comment as needed  Activities of Daily Living      Permission Sought/Granted                  Emotional Assessment       Orientation: : Oriented to Situation,Oriented to  Time,Oriented to Place,Oriented to Self Alcohol / Substance Use: Not Applicable Psych Involvement: No (comment)  Admission diagnosis:  Pneumonia due to COVID-19 virus [U07.1, J12.82] Patient Active Problem List   Diagnosis Date Noted  . Pneumonia due to COVID-19 virus 10/22/2020   PCP:  Pcp, No Pharmacy:  No Pharmacies Listed    Social Determinants of Health (SDOH) Interventions    Readmission Risk Interventions No flowsheet data found.

## 2020-10-24 ENCOUNTER — Encounter (HOSPITAL_COMMUNITY): Payer: Self-pay | Admitting: Internal Medicine

## 2020-10-24 LAB — CBC WITH DIFFERENTIAL/PLATELET
Abs Immature Granulocytes: 0.05 10*3/uL (ref 0.00–0.07)
Basophils Absolute: 0 10*3/uL (ref 0.0–0.1)
Basophils Relative: 0 %
Eosinophils Absolute: 0 10*3/uL (ref 0.0–0.5)
Eosinophils Relative: 0 %
HCT: 38.7 % — ABNORMAL LOW (ref 39.0–52.0)
Hemoglobin: 12.1 g/dL — ABNORMAL LOW (ref 13.0–17.0)
Immature Granulocytes: 1 %
Lymphocytes Relative: 18 %
Lymphs Abs: 1.3 10*3/uL (ref 0.7–4.0)
MCH: 29.7 pg (ref 26.0–34.0)
MCHC: 31.3 g/dL (ref 30.0–36.0)
MCV: 95.1 fL (ref 80.0–100.0)
Monocytes Absolute: 0.3 10*3/uL (ref 0.1–1.0)
Monocytes Relative: 4 %
Neutro Abs: 5.4 10*3/uL (ref 1.7–7.7)
Neutrophils Relative %: 77 %
Platelets: 312 10*3/uL (ref 150–400)
RBC: 4.07 MIL/uL — ABNORMAL LOW (ref 4.22–5.81)
RDW: 13.2 % (ref 11.5–15.5)
WBC: 7 10*3/uL (ref 4.0–10.5)
nRBC: 0 % (ref 0.0–0.2)

## 2020-10-24 LAB — COMPREHENSIVE METABOLIC PANEL
ALT: 44 U/L (ref 0–44)
AST: 38 U/L (ref 15–41)
Albumin: 2.5 g/dL — ABNORMAL LOW (ref 3.5–5.0)
Alkaline Phosphatase: 49 U/L (ref 38–126)
Anion gap: 14 (ref 5–15)
BUN: 13 mg/dL (ref 6–20)
CO2: 23 mmol/L (ref 22–32)
Calcium: 9.1 mg/dL (ref 8.9–10.3)
Chloride: 101 mmol/L (ref 98–111)
Creatinine, Ser: 0.84 mg/dL (ref 0.61–1.24)
GFR, Estimated: >60 mL/min (ref >60)
Glucose, Bld: 176 mg/dL — ABNORMAL HIGH (ref 70–99)
Potassium: 4.6 mmol/L (ref 3.5–5.1)
Sodium: 138 mmol/L (ref 135–145)
Total Bilirubin: 0.6 mg/dL (ref 0.3–1.2)
Total Protein: 6.9 g/dL (ref 6.5–8.1)

## 2020-10-24 LAB — GLUCOSE, CAPILLARY
Glucose-Capillary: 191 mg/dL — ABNORMAL HIGH (ref 70–99)
Glucose-Capillary: 198 mg/dL — ABNORMAL HIGH (ref 70–99)
Glucose-Capillary: 213 mg/dL — ABNORMAL HIGH (ref 70–99)
Glucose-Capillary: 195 mg/dL — ABNORMAL HIGH (ref 70–99)

## 2020-10-24 LAB — PROCALCITONIN: Procalcitonin: <0.1 ng/mL

## 2020-10-24 LAB — MAGNESIUM: Magnesium: 2.5 mg/dL — ABNORMAL HIGH (ref 1.7–2.4)

## 2020-10-24 LAB — D-DIMER, QUANTITATIVE: D-Dimer, Quant: 2.35 ug/mL-FEU — ABNORMAL HIGH (ref 0.00–0.50)

## 2020-10-24 LAB — C-REACTIVE PROTEIN: CRP: 18.6 mg/dL — ABNORMAL HIGH (ref <1.0)

## 2020-10-24 LAB — BRAIN NATRIURETIC PEPTIDE: B Natriuretic Peptide: 36.9 pg/mL (ref 0.0–100.0)

## 2020-10-24 MED ORDER — LACTATED RINGERS IV SOLN: INTRAVENOUS | Status: AC

## 2020-10-24 NOTE — Progress Notes (Signed)
PROGRESS NOTE                                                                                                                                                                                                             Patient Demographics:    Troy Diaz, is a 49 y.o. male, DOB - 1971-01-11, [id number]  Outpatient Primary MD for the patient is Pcp, No    LOS - 2  Admit date - 10/22/2020    Chief Complaint  Patient presents with  . Shortness of Breath       Brief Narrative (HPI from H&P)  - Troy Diaz is a 49 y.o. male with medical history significant for severe morbid obesity, not on any medication who presented to [facility] ED due to gradually worsening shortness of breath and fatigue x3 days.  Associated with a productive cough of greenish sputum.  Non smoker.  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   Subjective:    Troy Diaz today has, No headache, No chest pain, No abdominal pain - No Nausea, No new weakness tingling or numbness,+ve SOB.   Assessment  & Plan :     1. Acute Hypoxic Resp. Failure due to Acute Covid 19 Viral Pneumonitis during the ongoing 2020 Covid 19 Pandemic - he fortunately not vaccinated and so far seems to have incurred moderate parenchymal lung injury due to COVID-19 pneumonia, has been appropriately placed on steroids and Remdesivir.  Will monitor.  He has consented for Actemra use if he gets worse.  Encouraged the patient to sit up in chair in the daytime use I-S and flutter valve for pulmonary toiletry and then prone in bed when at night.  Will advance activity and titrate down oxygen as possible.  Actemra/Baricitinib  off label use - patient was told that if COVID-19 pneumonitis gets worse we might  potentially use Actemra off label, patient denies any known history of active diverticulitis, tuberculosis or hepatitis, understands the risks and benefits and wants to proceed with Actemra treatment if required.   SpO2: 99 % O2 Flow Rate (L/min): 2 L/min  Recent Labs  Lab 10/22/20 1209 10/22/20 2244 10/23/20  8127 10/23/20 1414 10/24/20 0443 10/24/20 0634  WBC 9.1  --  8.1  --  7.0  --   HGB 13.6  --  13.1  --  12.1*  --   HCT 40.8  --  41.6  --  38.7*  --   PLT 280  --  309  --  312  --   CRP  --   --  24.2*  --  18.6*  --   BNP  --   --   --  16.9  --  36.9  DDIMER  --   --  3.60*  --  2.35*  --   PROCALCITON  --   --  0.13  --  <0.10  --   AST  --   --  49*  --  38  --   ALT  --   --  37  --  44  --   ALKPHOS  --   --  46  --  49  --   BILITOT  --   --  0.6  --  0.6  --   ALBUMIN  --   --  2.7*  --  2.5*  --   SARSCOV2NAA  --  POSITIVE*  --   --   --   --    Lab Results  Component Value Date   HGBA1C 6.6 (H) 10/23/2020     2.  Morbid obesity.  BMI 50.  Follow with PCP.  3. Weakness - IVF, PT-OT  4. DM 2 ? - check A1c, Lantus + ISS, may need education.   CBG (last 3)  Recent Labs    10/23/20 1710 10/23/20 2357 10/24/20 0751  GLUCAP 179* 157* 191*      Condition - Extremely Guarded  Family Communication  :  None listed  Code Status :  Full  Consults  :  None  Procedures  :    PUD Prophylaxis : PPI  Disposition Plan  :    Status is: Inpatient  Remains inpatient appropriate because:IV treatments appropriate due to intensity of illness or inability to take PO   Dispo:  Patient From: Home  Planned Disposition: Home  Expected discharge date: 10/25/2020  Medically stable for discharge: No   DVT Prophylaxis  :  Lovenox   Lab Results  Component Value Date   PLT 312 10/24/2020    Diet :  Diet Order            Diet regular Room service appropriate? Yes; Fluid consistency: Thin  Diet effective now                  Inpatient  Medications  Scheduled Meds: . baricitinib  4 mg Oral Daily  . cholecalciferol  1,000 Units Oral Daily  . enoxaparin (LOVENOX) injection  75 mg Subcutaneous Q24H  . insulin aspart  0-15 Units Subcutaneous TID WC  . insulin aspart  0-5 Units Subcutaneous QHS  . insulin glargine  20 Units Subcutaneous Daily  . Ipratropium-Albuterol  1 puff Inhalation Q6H  . methylPREDNISolone (SOLU-MEDROL) injection  60 mg Intravenous Q12H  . multivitamin with minerals  1 tablet Oral Daily  . pantoprazole  40 mg Oral Daily  . zinc sulfate  220 mg Oral Daily   Continuous Infusions: . lactated ringers    . remdesivir 100 mg in NS 100 mL 100 mg (10/24/20 0920)   PRN Meds:.acetaminophen, guaiFENesin-dextromethorphan, ondansetron (ZOFRAN) IV  Antibiotics  :    Anti-infectives (From admission,  onward)   Start     Dose/Rate Route Frequency Ordered Stop   10/23/20 1000  remdesivir 100 mg in sodium chloride 0.9 % 100 mL IVPB  Status:  Discontinued       "Followed by" Linked Group Details   100 mg 200 mL/hr over 30 Minutes Intravenous Daily 10/22/20 1956 10/22/20 2002   10/23/20 1000  remdesivir 100 mg in sodium chloride 0.9 % 100 mL IVPB       "Followed by" Linked Group Details   100 mg 200 mL/hr over 30 Minutes Intravenous Daily 10/22/20 2002 10/27/20 0959   10/23/20 1000  azithromycin (ZITHROMAX) tablet 250 mg  Status:  Discontinued       "Followed by" Linked Group Details   250 mg Oral Daily 10/22/20 2050 10/23/20 0916   10/22/20 2100  azithromycin (ZITHROMAX) tablet 500 mg       "Followed by" Linked Group Details   500 mg Oral Daily 10/22/20 2050 10/22/20 2220   10/22/20 2045  remdesivir 100 mg in sodium chloride 0.9 % 100 mL IVPB       "Followed by" Linked Group Details   100 mg 200 mL/hr over 30 Minutes Intravenous  Once 10/22/20 2002 10/23/20 0308   10/22/20 2015  remdesivir 100 mg in sodium chloride 0.9 % 100 mL IVPB       "Followed by" Linked Group Details   100 mg 200 mL/hr over 30  Minutes Intravenous  Once 10/22/20 2002 10/23/20 0131   10/22/20 2000  remdesivir 200 mg in sodium chloride 0.9% 250 mL IVPB  Status:  Discontinued       "Followed by" Linked Group Details   200 mg 580 mL/hr over 30 Minutes Intravenous Once 10/22/20 1956 10/22/20 2002       Time Spent in minutes  30   Susa Raring M.D on 10/24/2020 at 10:57 AM  To page go to [email or web address]   [facility] -  Office  [phone]    See all Orders from today for further details    Objective:   Vitals:   10/23/20 2120 10/23/20 2300 10/24/20 0408 10/24/20 0856  BP: 115/76 122/84 118/81 (!) 107/56  Pulse: 79 86 77 92  Resp: (!) 22 18 (!) 24 20  Temp:  97.8 F (36.6 C) 97.7 F (36.5 C) 98 F (36.7 C)  TempSrc:  Oral Axillary Oral  SpO2: 90% 97% 98% 99%  Weight:      Height:        Wt Readings from Last 3 Encounters:  10/22/20 (!) 158.8 kg     Intake/Output Summary (Last 24 hours) at 10/24/2020 1057 Last data filed at 10/24/2020 0857 Gross per 24 hour  Intake 1562.06 ml  Output --  Net 1562.06 ml     Physical Exam  Awake Alert, No new F.N deficits, Normal affect [place].AT,PERRAL Supple Neck,No JVD, No cervical lymphadenopathy appriciated.  Symmetrical Chest wall movement, Good air movement bilaterally, CTAB RRR,No Gallops,Rubs or new Murmurs, No Parasternal Heave +ve B.Sounds, Abd Soft, No tenderness, No organomegaly appriciated, No rebound - guarding or rigidity. No Cyanosis, Clubbing or edema, No new Rash or bruise       Data Review:    CBC Recent Labs  Lab 10/22/20 1209 10/23/20 0629 10/24/20 0443  WBC 9.1 8.1 7.0  HGB 13.6 13.1 12.1*  HCT 40.8 41.6 38.7*  PLT 280 309 312  MCV 92.9 95.0 95.1  MCH 31.0 29.9 29.7  MCHC 33.3 31.5 31.3  RDW 13.3 13.3 13.2  LYMPHSABS  --  2.9 1.3  MONOABS  --  0.6 0.3  EOSABS  --  0.0 0.0  BASOSABS  --  0.0 0.0    Recent Labs  Lab 10/22/20 1209 10/23/20 0629 10/23/20 1413 10/23/20 1414 10/24/20 0443 10/24/20 0634   NA 136 135  --   --  138  --   K 4.1 3.5  --   --  4.6  --   CL 96* 96*  --   --  101  --   CO2 27 26  --   --  23  --   GLUCOSE 137* 139*  --   --  176*  --   BUN 8 10  --   --  13  --   CREATININE 1.03 0.94  --   --  0.84  --   CALCIUM 8.6* 8.7*  --   --  9.1  --   AST  --  49*  --   --  38  --   ALT  --  37  --   --  44  --   ALKPHOS  --  46  --   --  49  --   BILITOT  --  0.6  --   --  0.6  --   ALBUMIN  --  2.7*  --   --  2.5*  --   MG  --  2.5*  --   --  2.5*  --   CRP  --  24.2*  --   --  18.6*  --   DDIMER  --  3.60*  --   --  2.35*  --   PROCALCITON  --  0.13  --   --  <0.10  --   HGBA1C  --   --  6.6*  --   --   --   BNP  --   --   --  16.9  --  36.9    ------------------------------------------------------------------------------------------------------------------ No results for input(s): CHOL, HDL, LDLCALC, TRIG, CHOLHDL, LDLDIRECT in the last 72 hours.  Lab Results  Component Value Date   HGBA1C 6.6 (H) 10/23/2020   ------------------------------------------------------------------------------------------------------------------ No results for input(s): TSH, T4TOTAL, T3FREE, THYROIDAB in the last 72 hours.  Invalid input(s): FREET3  Cardiac Enzymes No results for input(s): CKMB, TROPONINI, MYOGLOBIN in the last 168 hours.  Invalid input(s): CK ------------------------------------------------------------------------------------------------------------------    Component Value Date/Time   BNP 36.9 10/24/2020 4098    Micro Results Recent Results (from the past 240 hour(s))  Resp Panel by RT-PCR (Flu A&B, Covid) Nasopharyngeal Swab     Status: Abnormal   Collection Time: 10/22/20 10:44 PM   Specimen: Nasopharyngeal Swab; Nasopharyngeal(NP) swabs in vial transport medium  Result Value Ref Range Status   SARS Coronavirus 2 by RT PCR POSITIVE (A) NEGATIVE Final    Comment: RESULT CALLED TO, READ BACK BY AND VERIFIED WITH: Ailene Ravel, A RN 10/23/20  at 0009 sk  (NOTE) SARS-CoV-2 target nucleic acids are DETECTED.  The SARS-CoV-2 RNA is generally detectable in upper respiratory specimens during the acute phase of infection. Positive results are indicative of the presence of the identified virus, but do not rule out bacterial infection or co-infection with other pathogens not detected by the test. Clinical correlation with patient history and other diagnostic information is necessary to determine patient infection status. The expected result is Negative.  Fact Sheet for Patients: BloggerCourse.com  Fact Sheet for Healthcare Providers: SeriousBroker.it  This test is not yet  approved or cleared by the [place] and  has been authorized for detection and/or diagnosis of SARS-CoV-2 by FDA under an Emergency Use Authorization (EUA).  This EUA will remain in effect (meaning this  test can be used) for the duration of  the COVID-19 declaration under Section 564(b)(1) of the Act, 21 U.S.C. section 360bbb-3(b)(1), unless the authorization is terminated or revoked sooner.     Influenza A by PCR NEGATIVE NEGATIVE Final   Influenza B by PCR NEGATIVE NEGATIVE Final    Comment: (NOTE) The Xpert Xpress SARS-CoV-2/FLU/RSV plus assay is intended as an aid in the diagnosis of influenza from Nasopharyngeal swab specimens and should not be used as a sole basis for treatment. Nasal washings and aspirates are unacceptable for Xpert Xpress SARS-CoV-2/FLU/RSV testing.  Fact Sheet for Patients: BloggerCourse.com  Fact Sheet for Healthcare Providers: SeriousBroker.it  This test is not yet approved or cleared by the [place] FDA and has been authorized for detection and/or diagnosis of SARS-CoV-2 by FDA under an Emergency Use Authorization (EUA). This EUA will remain in effect (meaning this test can be used) for the duration of  the COVID-19 declaration under Section 564(b)(1) of the Act, 21 U.S.C. section 360bbb-3(b)(1), unless the authorization is terminated or revoked.  Performed at [facility], [address], [place], [place] [address]     Radiology Reports DG Chest Portable 1 View  Result Date: 10/22/2020 CLINICAL DATA:  COVID positive.  Short of breath EXAM: PORTABLE CHEST 1 VIEW COMPARISON:  None. FINDINGS: Diffuse bilateral patchy infiltrates throughout both lungs predominately in the bases. No effusion. Heart size mildly enlarged. No vascular congestion. IMPRESSION: Extensive bibasilar airspace disease compatible with COVID pneumonia. Electronically Signed   By: Marlan Palau M.D.   On: 10/22/2020 12:32                                                                      PROGRESS NOTE                                                                                                                                                                                                             Patient Demographics:    Troy Diaz, is a 49 y.o. male, DOB - Apr 07, 1971, [id number]  Outpatient Primary  MD for the patient is Pcp, No    LOS - 2  Admit date - 10/22/2020    Chief Complaint  Patient presents with  . Shortness of Breath       Brief Narrative (HPI from H&P)  - Troy Diaz is a 49 y.o. male with medical history significant for severe morbid obesity, not on any medication who presented to [facility] ED due to gradually worsening shortness of breath and fatigue x3 days.  Associated with a productive cough of greenish sputum.  Non smoker.  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   Subjective:   Patient in bed, appears comfortable, denies any headache, no fever, no chest pain or pressure, no shortness of  breath , no abdominal pain. No focal weakness.   Assessment  & Plan :     1.  Acute Hypoxic Resp. Failure due to Acute Covid 19 Viral Pneumonitis during the ongoing 2020 Covid 19 Pandemic - he fortunately not vaccinated and so far seems to have incurred moderate parenchymal lung injury due to COVID-19 pneumonia, has been appropriately placed on steroids and Remdesivir.  Will monitor.  He has consented for Actemra use if he gets worse.  Encouraged the patient to sit up in chair in the daytime use I-S and flutter valve for pulmonary toiletry and then prone in bed when at night.  Will advance activity and titrate down oxygen as possible.   SpO2: 99 % O2 Flow Rate (L/min): 2 L/min  Recent Labs  Lab 10/22/20 1209 10/22/20 2244 10/23/20 0629 10/23/20 1414 10/24/20 0443 10/24/20 0634  WBC 9.1  --  8.1  --  7.0  --   HGB 13.6  --  13.1  --  12.1*  --   HCT 40.8  --  41.6  --  38.7*  --   PLT 280  --  309  --  312  --   CRP  --   --  24.2*  --  18.6*  --   BNP  --   --   --  16.9  --  36.9  DDIMER  --   --  3.60*  --  2.35*  --   PROCALCITON  --   --  0.13  --  <0.10  --   AST  --   --  49*  --  38  --   ALT  --   --  37  --  44  --   ALKPHOS  --   --  46  --  49  --   BILITOT  --   --  0.6  --  0.6  --   ALBUMIN  --   --  2.7*  --  2.5*  --   SARSCOV2NAA  --  POSITIVE*  --   --   --   --    Lab Results  Component Value Date   HGBA1C 6.6 (H) 10/23/2020     2.  Morbid obesity.  BMI 50.  Follow with PCP.  3. Weakness - IVF, PT-OT  4.  Elevated but downtrending D-dimer.  Risk of DVT is higher, on moderate dose Lovenox.  Check Leg [place], Monitor.   5.  DM 2 ? - check A1c, Lantus + ISS, may need education.   CBG (last 3)  Recent Labs    10/23/20 1710 10/23/20 2357 10/24/20 0751  GLUCAP 179* 157* 191*   Lab Results  Component Value Date   HGBA1C 6.6 (H) 10/23/2020      Condition - Extremely Guarded  Family Communication  :  None listed  Code Status :   Full  Consults  :  None  Procedures  :    Leg [place] -   PUD Prophylaxis : PPI  Disposition Plan  :    Status is: Inpatient  Remains inpatient appropriate because:IV treatments appropriate due to intensity of illness or inability to take PO   Dispo:  Patient From: Home  Planned Disposition: Home  Expected discharge date: 10/25/2020  Medically stable for discharge: No   DVT Prophylaxis  :  Lovenox   Lab Results  Component Value Date   PLT 312 10/24/2020    Diet :  Diet Order            Diet regular Room service appropriate? Yes; Fluid consistency: Thin  Diet effective now                  Inpatient Medications  Scheduled Meds: . baricitinib  4 mg Oral Daily  . cholecalciferol  1,000 Units Oral Daily  . enoxaparin (LOVENOX) injection  75 mg Subcutaneous Q24H  . insulin aspart  0-15 Units Subcutaneous TID WC  . insulin aspart  0-5 Units Subcutaneous QHS  . insulin glargine  20 Units Subcutaneous Daily  . Ipratropium-Albuterol  1 puff Inhalation Q6H  . methylPREDNISolone (SOLU-MEDROL) injection  60 mg Intravenous Q12H  . multivitamin with minerals  1 tablet Oral Daily  . pantoprazole  40 mg Oral Daily  . zinc sulfate  220 mg Oral Daily   Continuous Infusions: . lactated ringers    . remdesivir 100 mg in NS 100 mL 100 mg (10/24/20 0920)   PRN Meds:.acetaminophen, guaiFENesin-dextromethorphan, ondansetron (ZOFRAN) IV  Antibiotics  :    Anti-infectives (From admission, onward)   Start     Dose/Rate Route Frequency Ordered Stop   10/23/20 1000  remdesivir 100 mg in sodium chloride 0.9 % 100 mL IVPB  Status:  Discontinued       "Followed by" Linked Group Details   100 mg 200 mL/hr over 30 Minutes Intravenous Daily 10/22/20 1956 10/22/20 2002   10/23/20 1000  remdesivir 100 mg in sodium chloride 0.9 % 100 mL IVPB       "Followed by" Linked Group Details   100 mg 200 mL/hr over 30 Minutes Intravenous Daily 10/22/20 2002 10/27/20 0959   10/23/20 1000   azithromycin (ZITHROMAX) tablet 250 mg  Status:  Discontinued       "Followed by" Linked Group Details   250 mg Oral Daily 10/22/20 2050 10/23/20 0916   10/22/20 2100  azithromycin (ZITHROMAX) tablet 500 mg       "Followed by" Linked Group Details   500 mg Oral Daily 10/22/20 2050 10/22/20 2220   10/22/20 2045  remdesivir 100 mg in sodium chloride 0.9 % 100 mL IVPB       "Followed by" Linked Group Details   100 mg 200 mL/hr over 30 Minutes Intravenous  Once 10/22/20 2002 10/23/20 0308   10/22/20 2015  remdesivir 100 mg in sodium chloride 0.9 % 100 mL IVPB       "Followed by" Linked Group Details   100 mg 200 mL/hr over 30 Minutes Intravenous  Once 10/22/20 2002 10/23/20 0131   10/22/20 2000  remdesivir 200  mg in sodium chloride 0.9% 250 mL IVPB  Status:  Discontinued       "Followed by" Linked Group Details   200 mg 580 mL/hr over 30 Minutes Intravenous Once 10/22/20 1956 10/22/20 2002       Time Spent in minutes  30   Susa RaringPrashant Erskine Steinfeldt M.D on 10/24/2020 at 11:03 AM  To page go to [email or web address]   [facility] -  Office  [phone]    See all Orders from today for further details    Objective:   Vitals:   10/23/20 2120 10/23/20 2300 10/24/20 0408 10/24/20 0856  BP: 115/76 122/84 118/81 (!) 107/56  Pulse: 79 86 77 92  Resp: (!) 22 18 (!) 24 20  Temp:  97.8 F (36.6 C) 97.7 F (36.5 C) 98 F (36.7 C)  TempSrc:  Oral Axillary Oral  SpO2: 90% 97% 98% 99%  Weight:      Height:        Wt Readings from Last 3 Encounters:  10/22/20 (!) 158.8 kg     Intake/Output Summary (Last 24 hours) at 10/24/2020 1103 Last data filed at 10/24/2020 0857 Gross per 24 hour  Intake 1562.06 ml  Output --  Net 1562.06 ml     Physical Exam  Awake Alert, No new F.N deficits, Normal affect [place].AT,PERRAL Supple Neck,No JVD, No cervical lymphadenopathy appriciated.  Symmetrical Chest wall movement, Good air movement bilaterally, CTAB RRR,No Gallops, Rubs or new Murmurs, No  Parasternal Heave +ve B.Sounds, Abd Soft, No tenderness, No organomegaly appriciated, No rebound - guarding or rigidity. No Cyanosis, Clubbing or edema, No new Rash or bruise    Data Review:    CBC Recent Labs  Lab 10/22/20 1209 10/23/20 0629 10/24/20 0443  WBC 9.1 8.1 7.0  HGB 13.6 13.1 12.1*  HCT 40.8 41.6 38.7*  PLT 280 309 312  MCV 92.9 95.0 95.1  MCH 31.0 29.9 29.7  MCHC 33.3 31.5 31.3  RDW 13.3 13.3 13.2  LYMPHSABS  --  2.9 1.3  MONOABS  --  0.6 0.3  EOSABS  --  0.0 0.0  BASOSABS  --  0.0 0.0    Recent Labs  Lab 10/22/20 1209 10/23/20 0629 10/23/20 1413 10/23/20 1414 10/24/20 0443 10/24/20 0634  NA 136 135  --   --  138  --   K 4.1 3.5  --   --  4.6  --   CL 96* 96*  --   --  101  --   CO2 27 26  --   --  23  --   GLUCOSE 137* 139*  --   --  176*  --   BUN 8 10  --   --  13  --   CREATININE 1.03 0.94  --   --  0.84  --   CALCIUM 8.6* 8.7*  --   --  9.1  --   AST  --  49*  --   --  38  --   ALT  --  37  --   --  44  --   ALKPHOS  --  46  --   --  49  --   BILITOT  --  0.6  --   --  0.6  --   ALBUMIN  --  2.7*  --   --  2.5*  --   MG  --  2.5*  --   --  2.5*  --   CRP  --  24.2*  --   --  18.6*  --   DDIMER  --  3.60*  --   --  2.35*  --   PROCALCITON  --  0.13  --   --  <0.10  --   HGBA1C  --   --  6.6*  --   --   --   BNP  --   --   --  16.9  --  36.9    ------------------------------------------------------------------------------------------------------------------ No results for input(s): CHOL, HDL, LDLCALC, TRIG, CHOLHDL, LDLDIRECT in the last 72 hours.  Lab Results  Component Value Date   HGBA1C 6.6 (H) 10/23/2020   ------------------------------------------------------------------------------------------------------------------ No results for input(s): TSH, T4TOTAL, T3FREE, THYROIDAB in the last 72 hours.  Invalid input(s): FREET3  Cardiac Enzymes No results for input(s): CKMB, TROPONINI, MYOGLOBIN in the last 168 hours.  Invalid  input(s): CK ------------------------------------------------------------------------------------------------------------------    Component Value Date/Time   BNP 36.9 10/24/2020 1610    Micro Results Recent Results (from the past 240 hour(s))  Resp Panel by RT-PCR (Flu A&B, Covid) Nasopharyngeal Swab     Status: Abnormal   Collection Time: 10/22/20 10:44 PM   Specimen: Nasopharyngeal Swab; Nasopharyngeal(NP) swabs in vial transport medium  Result Value Ref Range Status   SARS Coronavirus 2 by RT PCR POSITIVE (A) NEGATIVE Final    Comment: RESULT CALLED TO, READ BACK BY AND VERIFIED WITH: Ailene Ravel, A RN 10/23/20 at 0009 sk  (NOTE) SARS-CoV-2 target nucleic acids are DETECTED.  The SARS-CoV-2 RNA is generally detectable in upper respiratory specimens during the acute phase of infection. Positive results are indicative of the presence of the identified virus, but do not rule out bacterial infection or co-infection with other pathogens not detected by the test. Clinical correlation with patient history and other diagnostic information is necessary to determine patient infection status. The expected result is Negative.  Fact Sheet for Patients: BloggerCourse.com  Fact Sheet for Healthcare Providers: SeriousBroker.it  This test is not yet approved or cleared by the [place] FDA and  has been authorized for detection and/or diagnosis of SARS-CoV-2 by FDA under an Emergency Use Authorization (EUA).  This EUA will remain in effect (meaning this  test can be used) for the duration of  the COVID-19 declaration under Section 564(b)(1) of the Act, 21 U.S.C. section 360bbb-3(b)(1), unless the authorization is terminated or revoked sooner.     Influenza A by PCR NEGATIVE NEGATIVE Final   Influenza B by PCR NEGATIVE NEGATIVE Final    Comment: (NOTE) The Xpert Xpress SARS-CoV-2/FLU/RSV plus assay is intended as an aid in  the diagnosis of influenza from Nasopharyngeal swab specimens and should not be used as a sole basis for treatment. Nasal washings and aspirates are unacceptable for Xpert Xpress SARS-CoV-2/FLU/RSV testing.  Fact Sheet for Patients: BloggerCourse.com  Fact Sheet for Healthcare Providers: SeriousBroker.it  This test is not yet approved or cleared by the [place] FDA and has been authorized for detection and/or diagnosis of SARS-CoV-2 by FDA under an Emergency Use Authorization (EUA). This EUA will remain in effect (meaning this test can be used) for the duration of the COVID-19 declaration under Section 564(b)(1) of the Act, 21 U.S.C. section 360bbb-3(b)(1), unless the authorization is terminated or revoked.  Performed at [facility], [address], [place], [place] [address]     Radiology Reports DG Chest Portable 1 View  Result Date: 10/22/2020 CLINICAL DATA:  COVID positive.  Short of breath EXAM: PORTABLE CHEST 1 VIEW COMPARISON:  None. FINDINGS: Diffuse bilateral patchy infiltrates  throughout both lungs predominately in the bases. No effusion. Heart size mildly enlarged. No vascular congestion. IMPRESSION: Extensive bibasilar airspace disease compatible with COVID pneumonia. Electronically Signed   By: Marlan Palau M.D.   On: 10/22/2020 12:32

## 2020-10-25 ENCOUNTER — Encounter (HOSPITAL_COMMUNITY): Payer: Self-pay | Admitting: Internal Medicine

## 2020-10-25 ENCOUNTER — Inpatient Hospital Stay (HOSPITAL_COMMUNITY): Payer: 59

## 2020-10-25 DIAGNOSIS — U071 COVID-19: Secondary | ICD-10-CM

## 2020-10-25 DIAGNOSIS — R7989 Other specified abnormal findings of blood chemistry: Secondary | ICD-10-CM

## 2020-10-25 LAB — CBC WITH DIFFERENTIAL/PLATELET
Abs Immature Granulocytes: 0.17 10*3/uL — ABNORMAL HIGH (ref 0.00–0.07)
Basophils Absolute: 0 10*3/uL (ref 0.0–0.1)
Basophils Relative: 0 %
Eosinophils Absolute: 0 10*3/uL (ref 0.0–0.5)
Eosinophils Relative: 0 %
HCT: 39.4 % (ref 39.0–52.0)
Hemoglobin: 12.4 g/dL — ABNORMAL LOW (ref 13.0–17.0)
Immature Granulocytes: 2 %
Lymphocytes Relative: 11 %
Lymphs Abs: 1.1 10*3/uL (ref 0.7–4.0)
MCH: 30 pg (ref 26.0–34.0)
MCHC: 31.5 g/dL (ref 30.0–36.0)
MCV: 95.4 fL (ref 80.0–100.0)
Monocytes Absolute: 0.7 10*3/uL (ref 0.1–1.0)
Monocytes Relative: 7 %
Neutro Abs: 8.8 10*3/uL — ABNORMAL HIGH (ref 1.7–7.7)
Neutrophils Relative %: 80 %
Platelets: 416 10*3/uL — ABNORMAL HIGH (ref 150–400)
RBC: 4.13 MIL/uL — ABNORMAL LOW (ref 4.22–5.81)
RDW: 13.2 % (ref 11.5–15.5)
WBC: 10.9 10*3/uL — ABNORMAL HIGH (ref 4.0–10.5)
nRBC: 0 % (ref 0.0–0.2)

## 2020-10-25 LAB — COMPREHENSIVE METABOLIC PANEL
ALT: 44 U/L (ref 0–44)
AST: 32 U/L (ref 15–41)
Albumin: 2.4 g/dL — ABNORMAL LOW (ref 3.5–5.0)
Alkaline Phosphatase: 46 U/L (ref 38–126)
Anion gap: 10 (ref 5–15)
BUN: 16 mg/dL (ref 6–20)
CO2: 25 mmol/L (ref 22–32)
Calcium: 8.9 mg/dL (ref 8.9–10.3)
Chloride: 101 mmol/L (ref 98–111)
Creatinine, Ser: 0.95 mg/dL (ref 0.61–1.24)
GFR, Estimated: >60 mL/min (ref >60)
Glucose, Bld: 265 mg/dL — ABNORMAL HIGH (ref 70–99)
Potassium: 4.2 mmol/L (ref 3.5–5.1)
Sodium: 136 mmol/L (ref 135–145)
Total Bilirubin: 0.7 mg/dL (ref 0.3–1.2)
Total Protein: 6.6 g/dL (ref 6.5–8.1)

## 2020-10-25 LAB — BRAIN NATRIURETIC PEPTIDE: B Natriuretic Peptide: 77 pg/mL (ref 0.0–100.0)

## 2020-10-25 LAB — C-REACTIVE PROTEIN: CRP: 8.2 mg/dL — ABNORMAL HIGH (ref <1.0)

## 2020-10-25 LAB — GLUCOSE, CAPILLARY
Glucose-Capillary: 192 mg/dL — ABNORMAL HIGH (ref 70–99)
Glucose-Capillary: 235 mg/dL — ABNORMAL HIGH (ref 70–99)
Glucose-Capillary: 178 mg/dL — ABNORMAL HIGH (ref 70–99)
Glucose-Capillary: 153 mg/dL — ABNORMAL HIGH (ref 70–99)

## 2020-10-25 LAB — PROCALCITONIN: Procalcitonin: <0.1 ng/mL

## 2020-10-25 LAB — D-DIMER, QUANTITATIVE: D-Dimer, Quant: 1.16 ug/mL-FEU — ABNORMAL HIGH (ref 0.00–0.50)

## 2020-10-25 LAB — MAGNESIUM: Magnesium: 2.2 mg/dL (ref 1.7–2.4)

## 2020-10-25 NOTE — Progress Notes (Signed)
Bilateral lower extremity venous study completed.      Please see CV Proc for preliminary results.   Zaydah Nawabi, RVT  

## 2020-10-25 NOTE — Progress Notes (Signed)
PROGRESS NOTE                                                                                                                                                                                                             Patient Demographics:    Troy Diaz, is a 49 y.o. male, DOB - 1971/06/29, ZOX:[id number]  Outpatient Primary MD for the patient is Pcp, No    LOS - 3  Admit date - 10/22/2020    Chief Complaint  Patient presents with  . Shortness of Breath       Brief Narrative (HPI from H&P)  - Troy Diaz is a 49 y.o. male with medical history significant for severe morbid obesity, not on any medication who presented to [facility] ED due to gradually worsening shortness of breath and fatigue x3 days.  Associated with a productive cough of greenish sputum.  Non smoker.  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   Subjective:    Troy Diaz today has, No headache, No chest pain, No abdominal pain - No Nausea, No new weakness tingling or numbness,+ve SOB.   Assessment  & Plan :     1. Acute Hypoxic Resp. Failure due to Acute Covid 19 Viral Pneumonitis during the ongoing 2020 Covid 19 Pandemic - he fortunately not vaccinated and so far seems to have incurred moderate parenchymal lung injury due to COVID-19 pneumonia, has been appropriately placed on steroids and Remdesivir.  Will monitor.  He has consented for Actemra use if he gets worse.  Encouraged the patient to sit up in chair in the daytime use I-S and flutter valve for pulmonary toiletry and then prone in bed when at night.  Will advance activity and titrate down oxygen as possible.  Actemra/Baricitinib  off label use - patient was told that if COVID-19 pneumonitis gets worse we might  potentially use Actemra off label, patient denies any known history of active diverticulitis, tuberculosis or hepatitis, understands the risks and benefits and wants to proceed with Actemra treatment if required.   SpO2: 99 % O2 Flow Rate (L/min): 1 L/min  Recent Labs  Lab 10/22/20 1209 10/22/20 2244 10/23/20  1610 10/23/20 1414 10/24/20 0443 10/24/20 0634 10/25/20 0517  WBC 9.1  --  8.1  --  7.0  --  10.9*  HGB 13.6  --  13.1  --  12.1*  --  12.4*  HCT 40.8  --  41.6  --  38.7*  --  39.4  PLT 280  --  309  --  312  --  416*  CRP  --   --  24.2*  --  18.6*  --  8.2*  BNP  --   --   --  16.9  --  36.9 77.0  DDIMER  --   --  3.60*  --  2.35*  --  1.16*  PROCALCITON  --   --  0.13  --  <0.10  --  <0.10  AST  --   --  49*  --  38  --  32  ALT  --   --  37  --  44  --  44  ALKPHOS  --   --  46  --  49  --  46  BILITOT  --   --  0.6  --  0.6  --  0.7  ALBUMIN  --   --  2.7*  --  2.5*  --  2.4*  SARSCOV2NAA  --  POSITIVE*  --   --   --   --   --    Lab Results  Component Value Date   HGBA1C 6.6 (H) 10/23/2020     2.  Morbid obesity.  BMI 50.  Follow with PCP.  3. Weakness - IVF, PT-OT  4. DM 2 ? - check A1c, Lantus + ISS, may need education.   CBG (last 3)  Recent Labs    10/24/20 1703 10/24/20 2054 10/25/20 0741  GLUCAP 213* 195* 192*      Condition - Extremely Guarded  Family Communication  :  None listed  Code Status :  Full  Consults  :  None  Procedures  :    PUD Prophylaxis : PPI  Disposition Plan  :    Status is: Inpatient  Remains inpatient appropriate because:IV treatments appropriate due to intensity of illness or inability to take PO   Dispo:  Patient From: Home  Planned Disposition: Home  Expected discharge date: 10/26/2020  Medically stable for discharge: No   DVT Prophylaxis  :  Lovenox   Lab Results  Component Value Date   PLT 416 (H) 10/25/2020    Diet :  Diet Order            Diet regular Room service appropriate? Yes;  Fluid consistency: Thin  Diet effective now                  Inpatient Medications  Scheduled Meds: . baricitinib  4 mg Oral Daily  . cholecalciferol  1,000 Units Oral Daily  . enoxaparin (LOVENOX) injection  75 mg Subcutaneous Q24H  . insulin aspart  0-15 Units Subcutaneous TID WC  . insulin aspart  0-5 Units Subcutaneous QHS  . insulin glargine  20 Units Subcutaneous Daily  . Ipratropium-Albuterol  1 puff Inhalation Q6H  . methylPREDNISolone (SOLU-MEDROL) injection  60 mg Intravenous Q12H  . multivitamin with minerals  1 tablet Oral Daily  . pantoprazole  40 mg Oral Daily  . zinc sulfate  220 mg Oral Daily   Continuous Infusions: . remdesivir 100 mg in NS 100 mL 100 mg (10/25/20 0912)   PRN Meds:.acetaminophen, guaiFENesin-dextromethorphan,  ondansetron (ZOFRAN) IV  Antibiotics  :    Anti-infectives (From admission, onward)   Start     Dose/Rate Route Frequency Ordered Stop   10/23/20 1000  remdesivir 100 mg in sodium chloride 0.9 % 100 mL IVPB  Status:  Discontinued       "Followed by" Linked Group Details   100 mg 200 mL/hr over 30 Minutes Intravenous Daily 10/22/20 1956 10/22/20 2002   10/23/20 1000  remdesivir 100 mg in sodium chloride 0.9 % 100 mL IVPB       "Followed by" Linked Group Details   100 mg 200 mL/hr over 30 Minutes Intravenous Daily 10/22/20 2002 10/27/20 0959   10/23/20 1000  azithromycin (ZITHROMAX) tablet 250 mg  Status:  Discontinued       "Followed by" Linked Group Details   250 mg Oral Daily 10/22/20 2050 10/23/20 0916   10/22/20 2100  azithromycin (ZITHROMAX) tablet 500 mg       "Followed by" Linked Group Details   500 mg Oral Daily 10/22/20 2050 10/22/20 2220   10/22/20 2045  remdesivir 100 mg in sodium chloride 0.9 % 100 mL IVPB       "Followed by" Linked Group Details   100 mg 200 mL/hr over 30 Minutes Intravenous  Once 10/22/20 2002 10/23/20 0308   10/22/20 2015  remdesivir 100 mg in sodium chloride 0.9 % 100 mL IVPB       "Followed by"  Linked Group Details   100 mg 200 mL/hr over 30 Minutes Intravenous  Once 10/22/20 2002 10/23/20 0131   10/22/20 2000  remdesivir 200 mg in sodium chloride 0.9% 250 mL IVPB  Status:  Discontinued       "Followed by" Linked Group Details   200 mg 580 mL/hr over 30 Minutes Intravenous Once 10/22/20 1956 10/22/20 2002       Time Spent in minutes  30   Susa Raring M.D on 10/25/2020 at 11:03 AM  To page go to [email or web address]   [facility] -  Office  [phone]    See all Orders from today for further details    Objective:   Vitals:   10/24/20 1623 10/24/20 2024 10/25/20 0401 10/25/20 0817  BP: 121/73 127/70 121/72 132/76  Pulse: 92 92 89 85  Resp: Temp: 97.9 F (36.6 C) 98.4 F (36.9 C) 98 F (36.7 C) 97.7 F (36.5 C)  TempSrc: Oral Oral Oral Oral  SpO2: 95% 96% 96% 99%  Weight:      Height:        Wt Readings from Last 3 Encounters:  10/22/20 (!) 158.8 kg     Intake/Output Summary (Last 24 hours) at 10/25/2020 1103 Last data filed at 10/25/2020 1005 Gross per 24 hour  Intake 836.48 ml  Output 1850 ml  Net -1013.52 ml     Physical Exam  Awake Alert, No new F.N deficits, Normal affect [place].AT,PERRAL Supple Neck,No JVD, No cervical lymphadenopathy appriciated.  Symmetrical Chest wall movement, Good air movement bilaterally, CTAB RRR,No Gallops,Rubs or new Murmurs, No Parasternal Heave +ve B.Sounds, Abd Soft, No tenderness, No organomegaly appriciated, No rebound - guarding or rigidity. No Cyanosis, Clubbing or edema, No new Rash or bruise       Data Review:    CBC Recent Labs  Lab 10/22/20 1209 10/23/20 0629 10/24/20 0443 10/25/20 0517  WBC 9.1 8.1 7.0 10.9*  HGB 13.6 13.1 12.1* 12.4*  HCT 40.8 41.6 38.7* 39.4  PLT 280 309 312 416*  MCV 92.9 95.0 95.1 95.4  MCH 31.0 29.9 29.7 30.0  MCHC 33.3 31.5 31.3 31.5  RDW 13.3 13.3 13.2 13.2  LYMPHSABS  --  2.9 1.3 1.1  MONOABS  --  0.6 0.3 0.7  EOSABS  --  0.0 0.0 0.0   BASOSABS  --  0.0 0.0 0.0    Recent Labs  Lab 10/22/20 1209 10/23/20 0629 10/23/20 1413 10/23/20 1414 10/24/20 0443 10/24/20 0634 10/25/20 0517  NA 136 135  --   --  138  --  136  K 4.1 3.5  --   --  4.6  --  4.2  CL 96* 96*  --   --  101  --  101  CO2 27 26  --   --  23  --  25  GLUCOSE 137* 139*  --   --  176*  --  265*  BUN 8 10  --   --  13  --  16  CREATININE 1.03 0.94  --   --  0.84  --  0.95  CALCIUM 8.6* 8.7*  --   --  9.1  --  8.9  AST  --  49*  --   --  38  --  32  ALT  --  37  --   --  44  --  44  ALKPHOS  --  46  --   --  49  --  46  BILITOT  --  0.6  --   --  0.6  --  0.7  ALBUMIN  --  2.7*  --   --  2.5*  --  2.4*  MG  --  2.5*  --   --  2.5*  --  2.2  CRP  --  24.2*  --   --  18.6*  --  8.2*  DDIMER  --  3.60*  --   --  2.35*  --  1.16*  PROCALCITON  --  0.13  --   --  <0.10  --  <0.10  HGBA1C  --   --  6.6*  --   --   --   --   BNP  --   --   --  16.9  --  36.9 77.0    ------------------------------------------------------------------------------------------------------------------ No results for input(s): CHOL, HDL, LDLCALC, TRIG, CHOLHDL, LDLDIRECT in the last 72 hours.  Lab Results  Component Value Date   HGBA1C 6.6 (H) 10/23/2020   ------------------------------------------------------------------------------------------------------------------ No results for input(s): TSH, T4TOTAL, T3FREE, THYROIDAB in the last 72 hours.  Invalid input(s): FREET3  Cardiac Enzymes No results for input(s): CKMB, TROPONINI, MYOGLOBIN in the last 168 hours.  Invalid input(s): CK ------------------------------------------------------------------------------------------------------------------    Component Value Date/Time   BNP 77.0 10/25/2020 0517    Micro Results Recent Results (from the past 240 hour(s))  Resp Panel by RT-PCR (Flu A&B, Covid) Nasopharyngeal Swab     Status: Abnormal   Collection Time: 10/22/20 10:44 PM   Specimen: Nasopharyngeal Swab;  Nasopharyngeal(NP) swabs in vial transport medium  Result Value Ref Range Status   SARS Coronavirus 2 by RT PCR POSITIVE (A) NEGATIVE Final    Comment: RESULT CALLED TO, READ BACK BY AND VERIFIED WITH: Ailene Ravel, A RN 10/23/20 at 0009 sk  (NOTE) SARS-CoV-2 target nucleic acids are DETECTED.  The SARS-CoV-2 RNA is generally detectable in upper respiratory specimens during the acute phase of infection. Positive results are indicative of the presence of the identified virus, but do not rule out bacterial  infection or co-infection with other pathogens not detected by the test. Clinical correlation with patient history and other diagnostic information is necessary to determine patient infection status. The expected result is Negative.  Fact Sheet for Patients: BloggerCourse.com  Fact Sheet for Healthcare Providers: SeriousBroker.it  This test is not yet approved or cleared by the [place] FDA and  has been authorized for detection and/or diagnosis of SARS-CoV-2 by FDA under an Emergency Use Authorization (EUA).  This EUA will remain in effect (meaning this  test can be used) for the duration of  the COVID-19 declaration under Section 564(b)(1) of the Act, 21 U.S.C. section 360bbb-3(b)(1), unless the authorization is terminated or revoked sooner.     Influenza A by PCR NEGATIVE NEGATIVE Final   Influenza B by PCR NEGATIVE NEGATIVE Final    Comment: (NOTE) The Xpert Xpress SARS-CoV-2/FLU/RSV plus assay is intended as an aid in the diagnosis of influenza from Nasopharyngeal swab specimens and should not be used as a sole basis for treatment. Nasal washings and aspirates are unacceptable for Xpert Xpress SARS-CoV-2/FLU/RSV testing.  Fact Sheet for Patients: BloggerCourse.com  Fact Sheet for Healthcare Providers: SeriousBroker.it  This test is not yet approved or cleared  by the [place] FDA and has been authorized for detection and/or diagnosis of SARS-CoV-2 by FDA under an Emergency Use Authorization (EUA). This EUA will remain in effect (meaning this test can be used) for the duration of the COVID-19 declaration under Section 564(b)(1) of the Act, 21 U.S.C. section 360bbb-3(b)(1), unless the authorization is terminated or revoked.  Performed at [facility], [address], [place], [place] [address]     Radiology Reports DG Chest Portable 1 View  Result Date: 10/22/2020 CLINICAL DATA:  COVID positive.  Short of breath EXAM: PORTABLE CHEST 1 VIEW COMPARISON:  None. FINDINGS: Diffuse bilateral patchy infiltrates throughout both lungs predominately in the bases. No effusion. Heart size mildly enlarged. No vascular congestion. IMPRESSION: Extensive bibasilar airspace disease compatible with COVID pneumonia. Electronically Signed   By: Marlan Palau M.D.   On: 10/22/2020 12:32                                                                      PROGRESS NOTE  Patient Demographics:    Troy Diaz, is a 49 y.o. male, DOB - 08-Dec-1970, BWG:[id number]  Outpatient Primary MD for the patient is Pcp, No    LOS - 3  Admit date - 10/22/2020    Chief Complaint  Patient presents with  . Shortness of Breath       Brief Narrative (HPI from H&P)  - GLENDAL CASSADAY is a 49 y.o. male with medical history significant for severe morbid obesity, not on any medication who presented to [facility] ED due to gradually worsening shortness of breath and fatigue x3 days.  Associated with a productive cough of greenish sputum.  Non smoker.  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   Subjective:   Patient in bed, appears comfortable, denies any headache, no fever, no chest pain or pressure, no shortness of breath , no abdominal pain. No focal weakness.   Assessment  & Plan :     1.  Acute Hypoxic Resp. Failure due to Acute Covid 19 Viral Pneumonitis during the ongoing 2020 Covid 19 Pandemic - he fortunately not vaccinated and so far seems to have incurred moderate parenchymal lung injury due to COVID-19 pneumonia, has been appropriately placed on steroids and Remdesivir.  Will monitor.  He has consented for Actemra use if he gets worse.  Encouraged the patient to sit up in chair in the daytime use I-S and flutter valve for pulmonary toiletry and then prone in bed when at night.  Will advance activity and titrate down oxygen as possible.   SpO2: 99 % O2 Flow Rate (L/min): 1 L/min  Recent Labs  Lab 10/22/20 1209 10/22/20 2244 10/23/20 0629 10/23/20 1414 10/24/20 0443 10/24/20 0634 10/25/20 0517  WBC 9.1  --  8.1  --  7.0  --  10.9*  HGB 13.6  --  13.1  --  12.1*  --  12.4*  HCT 40.8  --  41.6  --  38.7*  --  39.4  PLT 280  --  309  --  312  --  416*  CRP  --   --  24.2*  --  18.6*  --  8.2*  BNP  --   --   --  16.9  --  36.9 77.0  DDIMER  --   --  3.60*  --  2.35*  --  1.16*  PROCALCITON  --   --  0.13  --  <0.10  --  <0.10  AST  --   --  49*  --  38  --  32  ALT  --   --  37  --  44  --  44  ALKPHOS  --   --  46  --  49  --  46  BILITOT  --   --  0.6  --  0.6  --  0.7  ALBUMIN  --   --  2.7*  --  2.5*  --  2.4*  SARSCOV2NAA  --  POSITIVE*  --   --   --   --   --    Lab Results  Component Value Date   HGBA1C 6.6 (H) 10/23/2020     2.  Morbid obesity.  BMI 50.  Follow with PCP.  3. Weakness - post IVF, PT-OT  4.  Elevated but  downtrending D-dimer.  Risk of DVT is higher, on moderate dose Lovenox.  Pending  Leg [place], Monitor.   5.  DM 2 ? - check  A1c, Lantus + ISS.   CBG (last 3)  Recent Labs    10/24/20 1703 10/24/20 2054 10/25/20 0741  GLUCAP 213* 195* 192*   Lab Results  Component Value Date   HGBA1C 6.6 (H) 10/23/2020      Condition - Extremely Guarded  Family Communication  :  None listed  Code Status :  Full  Consults  :  None  Procedures  :    Leg [place] -   PUD Prophylaxis : PPI  Disposition Plan  :    Status is: Inpatient  Remains inpatient appropriate because:IV treatments appropriate due to intensity of illness or inability to take PO   Dispo:  Patient From: Home  Planned Disposition: Home  Expected discharge date: 10/26/2020  Medically stable for discharge: No   DVT Prophylaxis  :  Lovenox   Lab Results  Component Value Date   PLT 416 (H) 10/25/2020    Diet :  Diet Order            Diet regular Room service appropriate? Yes; Fluid consistency: Thin  Diet effective now                  Inpatient Medications  Scheduled Meds: . baricitinib  4 mg Oral Daily  . cholecalciferol  1,000 Units Oral Daily  . enoxaparin (LOVENOX) injection  75 mg Subcutaneous Q24H  . insulin aspart  0-15 Units Subcutaneous TID WC  . insulin aspart  0-5 Units Subcutaneous QHS  . insulin glargine  20 Units Subcutaneous Daily  . Ipratropium-Albuterol  1 puff Inhalation Q6H  . methylPREDNISolone (SOLU-MEDROL) injection  60 mg Intravenous Q12H  . multivitamin with minerals  1 tablet Oral Daily  . pantoprazole  40 mg Oral Daily  . zinc sulfate  220 mg Oral Daily   Continuous Infusions: . remdesivir 100 mg in NS 100 mL 100 mg (10/25/20 0912)   PRN Meds:.acetaminophen, guaiFENesin-dextromethorphan, ondansetron (ZOFRAN) IV  Antibiotics  :    Anti-infectives (From admission, onward)   Start     Dose/Rate Route Frequency Ordered Stop   10/23/20 1000  remdesivir 100 mg in sodium chloride 0.9 % 100 mL IVPB  Status:  Discontinued       "Followed by" Linked Group Details   100 mg 200 mL/hr over 30 Minutes  Intravenous Daily 10/22/20 1956 10/22/20 2002   10/23/20 1000  remdesivir 100 mg in sodium chloride 0.9 % 100 mL IVPB       "Followed by" Linked Group Details   100 mg 200 mL/hr over 30 Minutes Intravenous Daily 10/22/20 2002 10/27/20 0959   10/23/20 1000  azithromycin (ZITHROMAX) tablet 250 mg  Status:  Discontinued       "Followed by" Linked Group Details   250 mg Oral Daily 10/22/20 2050 10/23/20 0916   10/22/20 2100  azithromycin (ZITHROMAX) tablet 500 mg       "Followed by" Linked Group Details   500 mg Oral Daily 10/22/20 2050 10/22/20 2220   10/22/20 2045  remdesivir 100 mg in sodium chloride 0.9 % 100 mL IVPB       "Followed by" Linked Group Details   100 mg 200 mL/hr over 30 Minutes Intravenous  Once 10/22/20 2002 10/23/20 0308   10/22/20 2015  remdesivir 100 mg in sodium chloride 0.9 % 100 mL IVPB       "  Followed by" Linked Group Details   100 mg 200 mL/hr over 30 Minutes Intravenous  Once 10/22/20 2002 10/23/20 0131   10/22/20 2000  remdesivir 200 mg in sodium chloride 0.9% 250 mL IVPB  Status:  Discontinued       "Followed by" Linked Group Details   200 mg 580 mL/hr over 30 Minutes Intravenous Once 10/22/20 1956 10/22/20 2002       Time Spent in minutes  30   Susa RaringPrashant Tremar Wickens M.D on 10/25/2020 at 11:03 AM  To page go to [email or web address]   [facility] -  Office  [phone]    See all Orders from today for further details    Objective:   Vitals:   10/24/20 1623 10/24/20 2024 10/25/20 0401 10/25/20 0817  BP: 121/73 127/70 121/72 132/76  Pulse: 92 92 89 85  Resp: 20 15 20 20   Temp: 97.9 F (36.6 C) 98.4 F (36.9 C) 98 F (36.7 C) 97.7 F (36.5 C)  TempSrc: Oral Oral Oral Oral  SpO2: 95% 96% 96% 99%  Weight:      Height:        Wt Readings from Last 3 Encounters:  10/22/20 (!) 158.8 kg     Intake/Output Summary (Last 24 hours) at 10/25/2020 1103 Last data filed at 10/25/2020 1005 Gross per 24 hour  Intake 836.48 ml  Output 1850 ml  Net  -1013.52 ml     Physical Exam  Awake Alert, No new F.N deficits, Normal affect [place].AT,PERRAL Supple Neck,No JVD, No cervical lymphadenopathy appriciated.  Symmetrical Chest wall movement, Good air movement bilaterally, CTAB RRR,No Gallops, Rubs or new Murmurs, No Parasternal Heave +ve B.Sounds, Abd Soft, No tenderness, No organomegaly appriciated, No rebound - guarding or rigidity. No Cyanosis, Clubbing or edema, No new Rash or bruise    Data Review:    CBC Recent Labs  Lab 10/22/20 1209 10/23/20 0629 10/24/20 0443 10/25/20 0517  WBC 9.1 8.1 7.0 10.9*  HGB 13.6 13.1 12.1* 12.4*  HCT 40.8 41.6 38.7* 39.4  PLT 280 309 312 416*  MCV 92.9 95.0 95.1 95.4  MCH 31.0 29.9 29.7 30.0  MCHC 33.3 31.5 31.3 31.5  RDW 13.3 13.3 13.2 13.2  LYMPHSABS  --  2.9 1.3 1.1  MONOABS  --  0.6 0.3 0.7  EOSABS  --  0.0 0.0 0.0  BASOSABS  --  0.0 0.0 0.0    Recent Labs  Lab 10/22/20 1209 10/23/20 0629 10/23/20 1413 10/23/20 1414 10/24/20 0443 10/24/20 0634 10/25/20 0517  NA 136 135  --   --  138  --  136  K 4.1 3.5  --   --  4.6  --  4.2  CL 96* 96*  --   --  101  --  101  CO2 27 26  --   --  23  --  25  GLUCOSE 137* 139*  --   --  176*  --  265*  BUN 8 10  --   --  13  --  16  CREATININE 1.03 0.94  --   --  0.84  --  0.95  CALCIUM 8.6* 8.7*  --   --  9.1  --  8.9  AST  --  49*  --   --  38  --  32  ALT  --  37  --   --  44  --  44  ALKPHOS  --  46  --   --  49  --  46  BILITOT  --  0.6  --   --  0.6  --  0.7  ALBUMIN  --  2.7*  --   --  2.5*  --  2.4*  MG  --  2.5*  --   --  2.5*  --  2.2  CRP  --  24.2*  --   --  18.6*  --  8.2*  DDIMER  --  3.60*  --   --  2.35*  --  1.16*  PROCALCITON  --  0.13  --   --  <0.10  --  <0.10  HGBA1C  --   --  6.6*  --   --   --   --   BNP  --   --   --  16.9  --  36.9 77.0    ------------------------------------------------------------------------------------------------------------------ No results for input(s): CHOL, HDL, LDLCALC, TRIG,  CHOLHDL, LDLDIRECT in the last 72 hours.  Lab Results  Component Value Date   HGBA1C 6.6 (H) 10/23/2020   ------------------------------------------------------------------------------------------------------------------ No results for input(s): TSH, T4TOTAL, T3FREE, THYROIDAB in the last 72 hours.  Invalid input(s): FREET3  Cardiac Enzymes No results for input(s): CKMB, TROPONINI, MYOGLOBIN in the last 168 hours.  Invalid input(s): CK ------------------------------------------------------------------------------------------------------------------    Component Value Date/Time   BNP 77.0 10/25/2020 0517    Micro Results Recent Results (from the past 240 hour(s))  Resp Panel by RT-PCR (Flu A&B, Covid) Nasopharyngeal Swab     Status: Abnormal   Collection Time: 10/22/20 10:44 PM   Specimen: Nasopharyngeal Swab; Nasopharyngeal(NP) swabs in vial transport medium  Result Value Ref Range Status   SARS Coronavirus 2 by RT PCR POSITIVE (A) NEGATIVE Final    Comment: RESULT CALLED TO, READ BACK BY AND VERIFIED WITH: Ailene Ravel, A RN 10/23/20 at 0009 sk  (NOTE) SARS-CoV-2 target nucleic acids are DETECTED.  The SARS-CoV-2 RNA is generally detectable in upper respiratory specimens during the acute phase of infection. Positive results are indicative of the presence of the identified virus, but do not rule out bacterial infection or co-infection with other pathogens not detected by the test. Clinical correlation with patient history and other diagnostic information is necessary to determine patient infection status. The expected result is Negative.  Fact Sheet for Patients: BloggerCourse.com  Fact Sheet for Healthcare Providers: SeriousBroker.it  This test is not yet approved or cleared by the [place] FDA and  has been authorized for detection and/or diagnosis of SARS-CoV-2 by FDA under an Emergency Use Authorization  (EUA).  This EUA will remain in effect (meaning this  test can be used) for the duration of  the COVID-19 declaration under Section 564(b)(1) of the Act, 21 U.S.C. section 360bbb-3(b)(1), unless the authorization is terminated or revoked sooner.     Influenza A by PCR NEGATIVE NEGATIVE Final   Influenza B by PCR NEGATIVE NEGATIVE Final    Comment: (NOTE) The Xpert Xpress SARS-CoV-2/FLU/RSV plus assay is intended as an aid in the diagnosis of influenza from Nasopharyngeal swab specimens and should not be used as a sole basis for treatment. Nasal washings and aspirates are unacceptable for Xpert Xpress SARS-CoV-2/FLU/RSV testing.  Fact Sheet for Patients: BloggerCourse.com  Fact Sheet for Healthcare Providers: SeriousBroker.it  This test is not yet approved or cleared by the [place] FDA and has been authorized for detection and/or diagnosis of SARS-CoV-2 by FDA under an Emergency Use Authorization (EUA). This EUA will remain in effect (meaning this test can be used) for the duration of the COVID-19  declaration under Section 564(b)(1) of the Act, 21 U.S.C. section 360bbb-3(b)(1), unless the authorization is terminated or revoked.  Performed at [facility], [address] [place], [place] [address]     Radiology Reports DG Chest Portable 1 View  Result Date: 10/22/2020 CLINICAL DATA:  COVID positive.  Short of breath EXAM: PORTABLE CHEST 1 VIEW COMPARISON:  None. FINDINGS: Diffuse bilateral patchy infiltrates throughout both lungs predominately in the bases. No effusion. Heart size mildly enlarged. No vascular congestion. IMPRESSION: Extensive bibasilar airspace disease compatible with COVID pneumonia. Electronically Signed   By: Marlan Palau M.D.   On: 10/22/2020 12:32

## 2020-10-25 NOTE — Progress Notes (Signed)
SATURATION QUALIFICATIONS: (This note is used to comply with regulatory documentation for home oxygen)  Patient Saturations on Room Air at Rest = 99%  Patient Saturations on Room Air while Ambulating = 84%  Patient Saturations on 5 Liters of oxygen while Ambulating = 89%  Please briefly explain why patient needs home oxygen: While ambulating patient required 5 L O2 to keep sat O2 > 88%.

## 2020-10-26 LAB — COMPREHENSIVE METABOLIC PANEL
ALT: 50 U/L — ABNORMAL HIGH (ref 0–44)
AST: 41 U/L (ref 15–41)
Albumin: 2.4 g/dL — ABNORMAL LOW (ref 3.5–5.0)
Alkaline Phosphatase: 54 U/L (ref 38–126)
Anion gap: 13 (ref 5–15)
BUN: 16 mg/dL (ref 6–20)
CO2: 23 mmol/L (ref 22–32)
Calcium: 8.6 mg/dL — ABNORMAL LOW (ref 8.9–10.3)
Chloride: 100 mmol/L (ref 98–111)
Creatinine, Ser: 1 mg/dL (ref 0.61–1.24)
GFR, Estimated: >60 mL/min (ref >60)
Glucose, Bld: 207 mg/dL — ABNORMAL HIGH (ref 70–99)
Potassium: 4.5 mmol/L (ref 3.5–5.1)
Sodium: 136 mmol/L (ref 135–145)
Total Bilirubin: 0.4 mg/dL (ref 0.3–1.2)
Total Protein: 6.1 g/dL — ABNORMAL LOW (ref 6.5–8.1)

## 2020-10-26 LAB — CBC WITH DIFFERENTIAL/PLATELET
Abs Immature Granulocytes: 0.23 10*3/uL — ABNORMAL HIGH (ref 0.00–0.07)
Basophils Absolute: 0 10*3/uL (ref 0.0–0.1)
Basophils Relative: 0 %
Eosinophils Absolute: 0 10*3/uL (ref 0.0–0.5)
Eosinophils Relative: 0 %
HCT: 39.8 % (ref 39.0–52.0)
Hemoglobin: 12.6 g/dL — ABNORMAL LOW (ref 13.0–17.0)
Immature Granulocytes: 2 %
Lymphocytes Relative: 10 %
Lymphs Abs: 1.1 10*3/uL (ref 0.7–4.0)
MCH: 29.9 pg (ref 26.0–34.0)
MCHC: 31.7 g/dL (ref 30.0–36.0)
MCV: 94.3 fL (ref 80.0–100.0)
Monocytes Absolute: 0.5 10*3/uL (ref 0.1–1.0)
Monocytes Relative: 5 %
Neutro Abs: 8.7 10*3/uL — ABNORMAL HIGH (ref 1.7–7.7)
Neutrophils Relative %: 83 %
Platelets: 389 10*3/uL (ref 150–400)
RBC: 4.22 MIL/uL (ref 4.22–5.81)
RDW: 13.2 % (ref 11.5–15.5)
WBC: 10.5 10*3/uL (ref 4.0–10.5)
nRBC: 0 % (ref 0.0–0.2)

## 2020-10-26 LAB — C-REACTIVE PROTEIN: CRP: 4.6 mg/dL — ABNORMAL HIGH (ref <1.0)

## 2020-10-26 LAB — BRAIN NATRIURETIC PEPTIDE: B Natriuretic Peptide: 67.2 pg/mL (ref 0.0–100.0)

## 2020-10-26 LAB — GLUCOSE, CAPILLARY
Glucose-Capillary: 191 mg/dL — ABNORMAL HIGH (ref 70–99)
Glucose-Capillary: 207 mg/dL — ABNORMAL HIGH (ref 70–99)

## 2020-10-26 LAB — MAGNESIUM: Magnesium: 2 mg/dL (ref 1.7–2.4)

## 2020-10-26 LAB — D-DIMER, QUANTITATIVE: D-Dimer, Quant: 1.61 ug/mL-FEU — ABNORMAL HIGH (ref 0.00–0.50)

## 2020-10-26 MED ORDER — ASPIRIN EC 81 MG PO TBEC: 81.0000 mg | DELAYED_RELEASE_TABLET | Freq: Every day | ORAL | 0 refills | Status: AC

## 2020-10-26 MED ORDER — METHYLPREDNISOLONE 4 MG PO TBPK: ORAL_TABLET | ORAL | 0 refills | Status: AC

## 2020-10-26 MED ORDER — ALBUTEROL SULFATE HFA 108 (90 BASE) MCG/ACT IN AERS: 2.0000 | INHALATION_SPRAY | Freq: Four times a day (QID) | RESPIRATORY_TRACT | 0 refills | Status: AC | PRN

## 2020-10-26 NOTE — Discharge Instructions (Signed)
Follow with Primary MD  in 7 days   Get CBC, CMP, 2 view Chest X ray -  checked next visit within 1 week by Primary MD   Activity: As tolerated with Full fall precautions use walker/cane & assistance as needed  Disposition Home   Diet: Heart Healthy   Special Instructions: If you have smoked or chewed Tobacco  in the last 2 yrs please stop smoking, stop any regular Alcohol  and or any Recreational drug use.  On your next visit with your primary care physician please Get Medicines reviewed and adjusted.  Please request your Prim.MD to go over all Hospital Tests and Procedure/Radiological results at the follow up, please get all Hospital records sent to your Prim MD by signing hospital release before you go home.  If you experience worsening of your admission symptoms, develop shortness of breath, life threatening emergency, suicidal or homicidal thoughts you must seek medical attention immediately by calling 911 or calling your MD immediately  if symptoms less severe.  You Must read complete instructions/literature along with all the possible adverse reactions/side effects for all the Medicines you take and that have been prescribed to you. Take any new Medicines after you have completely understood and accpet all the possible adverse reactions/side effects.                                                                           [facility]                            [address] [place], [place] [address]      Troy Diaz was admitted to the Hospital on 10/22/2020 and Discharged  10/26/2020 and should be excused from work/school   for 21  days starting from date of his initial COVID-19 diagnosis may return to work/school without any restrictions.  Call Susa Raring MD, [facility]  [phone] with questions.  Susa Raring M.D on 10/26/2020,at 12:01 PM  [facility]   Office  [phone]      Person Under Monitoring Name: Troy Diaz  Location: [address] [place] [address]   Infection Prevention Recommendations for Individuals Confirmed to have, or Being Evaluated for, 2019 Novel Coronavirus (COVID-19) Infection Who Receive Care at Home  Individuals who are confirmed to have, or are being evaluated for, COVID-19 should follow the prevention steps below until a healthcare provider or local or state health department says they can return to normal activities.  Stay home except to get medical care You should restrict activities outside your home, except for getting medical care. Do not go to work, school, or public areas, and do not use public transportation or taxis.  Call ahead before visiting your doctor Before your medical appointment, call the healthcare provider and tell them that you have, or are being evaluated for, COVID-19 infection. This will help the healthcare provider's office take steps to keep other people from getting infected. Ask your healthcare provider to call the local or state health department.  Monitor your symptoms Seek prompt medical attention if your illness is worsening (e.g., difficulty breathing). Before going to your medical appointment,  call the healthcare provider and tell them that you have, or are being evaluated for, COVID-19 infection. Ask your healthcare provider to call the local or state health department.  Wear a facemask You should wear a facemask that covers your nose and mouth when you are in the same room with other people and when you visit a healthcare provider. People who live with or visit you should also wear a facemask while they are in the same room with you.  Separate yourself from other people in your home As much as possible, you should stay in a different room from other people in your home. Also, you should use a separate bathroom, if available.  Avoid sharing household items You should not share dishes, drinking glasses, cups,  eating utensils, towels, bedding, or other items with other people in your home. After using these items, you should wash them thoroughly with soap and water.  Cover your coughs and sneezes Cover your mouth and nose with a tissue when you cough or sneeze, or you can cough or sneeze into your sleeve. Throw used tissues in a lined trash can, and immediately wash your hands with soap and water for at least 20 seconds or use an alcohol-based hand rub.  Wash your [employer]hands Wash your hands often and thoroughly with soap and water for at least 20 seconds. You can use an alcohol-based hand sanitizer if soap and water are not available and if your hands are not visibly dirty. Avoid touching your eyes, nose, and mouth with unwashed hands.   Prevention Steps for Caregivers and Household Members of Individuals Confirmed to have, or Being Evaluated for, COVID-19 Infection Being Cared for in the Home  If you live with, or provide care at home for, a person confirmed to have, or being evaluated for, COVID-19 infection please follow these guidelines to prevent infection:  Follow healthcare provider's instructions Make sure that you understand and can help the patient follow any healthcare provider instructions for all care.  Provide for the patient's basic needs You should help the patient with basic needs in the home and provide support for getting groceries, prescriptions, and other personal needs.  Monitor the patient's symptoms If they are getting sicker, call his or her medical provider and tell them that the patient has, or is being evaluated for, COVID-19 infection. This will help the healthcare provider's office take steps to keep other people from getting infected. Ask the healthcare provider to call the local or state health department.  Limit the number of people who have contact with the patient  If possible, have only one caregiver for the patient.  Other household members should stay in  another home or place of residence. If this is not possible, they should stay  in another room, or be separated from the patient as much as possible. Use a separate bathroom, if available.  Restrict visitors who do not have an essential need to be in the home.  Keep older adults, very young children, and other sick people away from the patient Keep older adults, very young children, and those who have compromised immune systems or chronic health conditions away from the patient. This includes people with chronic heart, lung, or kidney conditions, diabetes, and cancer.  Ensure good ventilation Make sure that shared spaces in the home have good air flow, such as from an air conditioner or an opened window, weather permitting.  Wash your hands often  Wash your hands often and thoroughly with soap and  water for at least 20 seconds. You can use an alcohol based hand sanitizer if soap and water are not available and if your hands are not visibly dirty.  Avoid touching your eyes, nose, and mouth with unwashed hands.  Use disposable paper towels to dry your hands. If not available, use dedicated cloth towels and replace them when they become wet.  Wear a facemask and gloves  Wear a disposable facemask at all times in the room and gloves when you touch or have contact with the patient's blood, body fluids, and/or secretions or excretions, such as sweat, saliva, sputum, nasal mucus, vomit, urine, or feces.  Ensure the mask fits over your nose and mouth tightly, and do not touch it during use.  Throw out disposable facemasks and gloves after using them. Do not reuse.  Wash your hands immediately after removing your facemask and gloves.  If your personal clothing becomes contaminated, carefully remove clothing and launder. Wash your hands after handling contaminated clothing.  Place all used disposable facemasks, gloves, and other waste in a lined container before disposing them with other  household waste.  Remove gloves and wash your hands immediately after handling these items.  Do not share dishes, glasses, or other household items with the patient  Avoid sharing household items. You should not share dishes, drinking glasses, cups, eating utensils, towels, bedding, or other items with a patient who is confirmed to have, or being evaluated for, COVID-19 infection.  After the person uses these items, you should wash them thoroughly with soap and water.  Wash laundry thoroughly  Immediately remove and wash clothes or bedding that have blood, body fluids, and/or secretions or excretions, such as sweat, saliva, sputum, nasal mucus, vomit, urine, or feces, on them.  Wear gloves when handling laundry from the patient.  Read and follow directions on labels of laundry or clothing items and detergent. In general, wash and dry with the warmest temperatures recommended on the label.  Clean all areas the individual has used often  Clean all touchable surfaces, such as counters, tabletops, doorknobs, bathroom fixtures, toilets, phones, keyboards, tablets, and bedside tables, every day. Also, clean any surfaces that may have blood, body fluids, and/or secretions or excretions on them.  Wear gloves when cleaning surfaces the patient has come in contact with.  Use a diluted bleach solution (e.g., dilute bleach with 1 part bleach and 10 parts water) or a household disinfectant with a label that says EPA-registered for coronaviruses. To make a bleach solution at home, add 1 tablespoon of bleach to 1 quart (4 cups) of water. For a larger supply, add  cup of bleach to 1 gallon (16 cups) of water.  Read labels of cleaning products and follow recommendations provided on product labels. Labels contain instructions for safe and effective use of the cleaning product including precautions you should take when applying the product, such as wearing gloves or eye protection and making sure you have  good ventilation during use of the product.  Remove gloves and wash hands immediately after cleaning.  Monitor yourself for signs and symptoms of illness Caregivers and household members are considered close contacts, should monitor their health, and will be asked to limit movement outside of the home to the extent possible. Follow the monitoring steps for close contacts listed on the symptom monitoring form.   ? If you have additional questions, contact your local health department or call the epidemiologist on call at [phone] (available 24/7). ? This guidance is subject  to change. For the most up-to-date guidance from [facility], please refer to their website: [email or web address]

## 2020-10-26 NOTE — Discharge Summary (Signed)
Troy Diaz RSW:[id number] DOB: [birth year] DOA: 10/22/2020  PCP: Pcp, No  Admit date: 10/22/2020  Discharge date: 10/26/2020  Admitted From: Home   Disposition:  Home   Recommendations for Outpatient Follow-up:   Follow up with PCP in 1-2 weeks  PCP Please obtain BMP/CBC, 2 view CXR in 1week,  (see Discharge instructions)   PCP Please follow up on the following pending results: Check CBC, CMP and a two-view chest x-ray in 7 to 10 days   Home Health: None   Equipment/Devices: 2lit o2 PRN  Consultations: None  Discharge Condition: Stable    CODE STATUS: Full    Diet Recommendation: Heart Healthy   Diet Order            Diet - low sodium heart healthy           Diet regular Room service appropriate? No; Fluid consistency: Thin  Diet effective now                  Chief Complaint  Patient presents with  . Shortness of Breath     Brief history of present illness from the day of admission and additional interim summary    Troy Diaz a 49 y.o.malewith medical history significant forsevere morbid obesity, not on any medicationwho presented to [facility] due to gradually worsening shortness of breath and fatigue x3 days. Associated with a productive cough of greenish sputum. 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                                                                 Hospital Course    1. Acute Hypoxic Resp. Failure due to Acute Covid 19 Viral Pneumonitis during the ongoing 2020 Covid 19 Pandemic - he fortunately not vaccinated and so far seems to have incurred moderate parenchymal  lung injury due to COVID-19 pneumonia, he was treated with combination of steroids and remdesivir and responded well.  Now symptom-free on room air at rest, upon ambulation does qualify for 2 L nasal cannula oxygen, will be discharged home with a Medrol Dosepak, rescue inhaler and 2 L nasal cannula oxygen.    Recent Labs  Lab 10/22/20 1209 10/22/20 2244 10/23/20 0629 10/23/20 1414 10/24/20 0443 10/24/20 0634 10/25/20 0517 10/26/20 0149  WBC 9.1  --  8.1  --  7.0  --  10.9* 10.5  CRP  --   --  24.2*  --  18.6*  --  8.2* 4.6*  DDIMER  --   --  3.60*  --  2.35*  --  1.16* 1.61*  BNP  --   --   --  16.9  --  36.9 77.0 67.2  PROCALCITON  --   --  0.13  --  <0.10  --  <0.10  --   AST  --   --  49*  --  38  --  32 41  ALT  --   --  37  --  44  --  44 50*  ALKPHOS  --   --  46  --  49  --  46 54  BILITOT  --   --  0.6  --  0.6  --  0.7 0.4  ALBUMIN  --   --  2.7*  --  2.5*  --  2.4* 2.4*  SARSCOV2NAA  --  POSITIVE*  --   --   --   --   --   --       2.  Mildly elevated D-dimer due to inflammation.  Leg ultrasound negative, 1 month of aspirin.  3.  Morbid obesity.  BMI 50.  Follow with PCP for   Discharge diagnosis     Active Problems:   Pneumonia due to COVID-19 virus    Discharge instructions    Discharge Instructions    Diet - low sodium heart healthy   Complete by: As directed    Discharge instructions   Complete by: As directed    Follow with Primary MD  in 7 days   Get CBC, CMP, 2 view Chest X ray -  checked next visit within 1 week by Primary MD   Activity: As tolerated with Full fall precautions use walker/cane & assistance as needed  Disposition Home   Diet: Heart Healthy   Special Instructions: If you have smoked or chewed Tobacco  in the last 2 yrs please stop smoking, stop any regular Alcohol  and or any Recreational drug use.  On your next visit with your primary care physician please Get Medicines reviewed and adjusted.  Please request your Prim.MD to  go over all Hospital Tests and Procedure/Radiological results at the follow up, please get all Hospital records sent to your Prim MD by signing hospital release before you go home.  If you experience worsening of your admission symptoms, develop shortness of breath, life threatening emergency, suicidal or homicidal thoughts you must seek medical attention immediately by calling 911 or calling your MD immediately  if symptoms less severe.  You Must read complete instructions/literature along with all the possible adverse reactions/side effects for all the Medicines you take and that have been prescribed to you. Take any new Medicines after you have completely understood and accpet all the possible adverse reactions/side effects.   Increase activity slowly   Complete by: As directed    MyChart COVID-19 home monitoring program   Complete by: Oct 26, 2020    Is the patient willing to use the MyChart Mobile App for home monitoring?: Yes   Temperature monitoring   Complete by: Oct 26, 2020    After how many days would you like to receive a notification of this patient's flowsheet entries?: 1      Discharge Medications   Allergies as of 10/26/2020   No Known Allergies     Medication List    TAKE these medications   albuterol 108 (90 Base) MCG/ACT inhaler Commonly known as: VENTOLIN HFA Inhale 2 puffs into the lungs every 6 (six) hours as needed for wheezing or shortness of breath.   aspirin EC 81 MG tablet Take 1 tablet (81 mg total) by mouth  daily.   methylPREDNISolone 4 MG Tbpk tablet Commonly known as: MEDROL DOSEPAK follow package directions            Durable Medical Equipment  (From admission, onward)         Start     Ordered   10/26/20 0749  For home use only DME oxygen  Once       Question Answer Comment  Length of Need 6 Months   Mode or (Route) Nasal cannula   Liters per Minute 2   Frequency Continuous (stationary and portable oxygen unit needed)   Oxygen  conserving device Yes   Oxygen delivery system Gas      10/26/20 0748           Follow-up Information    [facility]. Schedule an appointment as soon as possible for a visit.   Contact information: [address] [place] [address] [phone]       [facility]. Schedule an appointment as soon as possible for a visit in 1 week(s).   Contact information: [address] [place] [address] [phone]              Major procedures and Radiology Reports - PLEASE review detailed and final reports thoroughly  -       DG Chest Portable 1 View  Result Date: 10/22/2020 CLINICAL DATA:  COVID positive.  Short of breath EXAM: PORTABLE CHEST 1 VIEW COMPARISON:  None. FINDINGS: Diffuse bilateral patchy infiltrates throughout both lungs predominately in the bases. No effusion. Heart size mildly enlarged. No vascular congestion. IMPRESSION: Extensive bibasilar airspace disease compatible with COVID pneumonia. Electronically Signed   By: Marlan Palau M.D.   On: 10/22/2020 12:32   VAS [place] LOWER EXTREMITY VENOUS (DVT)  Result Date: 10/25/2020  Lower Venous DVT Study Other Indications: Covid, ELevated D-DImer. Comparison Study: No Previous exam Performing Technologist: Clint Guy RVT  Examination Guidelines: A complete evaluation includes B-mode imaging, spectral Doppler, color Doppler, and power Doppler as needed of all accessible portions of each vessel. Bilateral testing is considered an integral part of a complete examination. Limited examinations for reoccurring indications may be performed as noted. The reflux portion of the exam is performed with the patient in reverse Trendelenburg.  +---------+---------------+---------+-----------+----------+--------------+ RIGHT    CompressibilityPhasicitySpontaneityPropertiesThrombus Aging  +---------+---------------+---------+-----------+----------+--------------+ CFV      Full           Yes      Yes                                 +---------+---------------+---------+-----------+----------+--------------+ SFJ      Full                                                        +---------+---------------+---------+-----------+----------+--------------+ FV Prox  Full                                                        +---------+---------------+---------+-----------+----------+--------------+ FV Mid   Full                                                        +---------+---------------+---------+-----------+----------+--------------+  FV DistalFull                                                        +---------+---------------+---------+-----------+----------+--------------+ PFV      Full                                                        +---------+---------------+---------+-----------+----------+--------------+ POP      Full           Yes      Yes                                 +---------+---------------+---------+-----------+----------+--------------+ PTV      Full                                                        +---------+---------------+---------+-----------+----------+--------------+ PERO     Full                                                        +---------+---------------+---------+-----------+----------+--------------+   +---------+---------------+---------+-----------+----------+--------------+ LEFT     CompressibilityPhasicitySpontaneityPropertiesThrombus Aging +---------+---------------+---------+-----------+----------+--------------+ CFV      Full           Yes      Yes                                 +---------+---------------+---------+-----------+----------+--------------+ SFJ      Full                                                         +---------+---------------+---------+-----------+----------+--------------+ FV Prox  Full                                                        +---------+---------------+---------+-----------+----------+--------------+ FV Mid   Full                                                        +---------+---------------+---------+-----------+----------+--------------+ FV DistalFull                                                        +---------+---------------+---------+-----------+----------+--------------+  PFV      Full                                                        +---------+---------------+---------+-----------+----------+--------------+ POP      Full           Yes      Yes                                 +---------+---------------+---------+-----------+----------+--------------+ PTV      Full                                                        +---------+---------------+---------+-----------+----------+--------------+ PERO     Full                                                        +---------+---------------+---------+-----------+----------+--------------+     Summary: RIGHT: - There is no evidence of deep vein thrombosis in the lower extremity.  - No cystic structure found in the popliteal fossa.  LEFT: - There is no evidence of deep vein thrombosis in the lower extremity.  - No cystic structure found in the popliteal fossa.  *See table(s) above for measurements and observations. Electronically signed by Waverly Ferrari MD on 10/25/2020 at 4:34:42 PM.    Final     Micro Results     Recent Results (from the past 240 hour(s))  Resp Panel by RT-PCR (Flu A&B, Covid) Nasopharyngeal Swab     Status: Abnormal   Collection Time: 10/22/20 10:44 PM   Specimen: Nasopharyngeal Swab; Nasopharyngeal(NP) swabs in vial transport medium  Result Value Ref Range Status   SARS Coronavirus 2 by RT PCR POSITIVE (A) NEGATIVE Final    Comment: RESULT CALLED  TO, READ BACK BY AND VERIFIED WITH: Ailene Ravel, A RN 10/23/20 at 0009 sk  (NOTE) SARS-CoV-2 target nucleic acids are DETECTED.  The SARS-CoV-2 RNA is generally detectable in upper respiratory specimens during the acute phase of infection. Positive results are indicative of the presence of the identified virus, but do not rule out bacterial infection or co-infection with other pathogens not detected by the test. Clinical correlation with patient history and other diagnostic information is necessary to determine patient infection status. The expected result is Negative.  Fact Sheet for Patients: BloggerCourse.com  Fact Sheet for Healthcare Providers: SeriousBroker.it  This test is not yet approved or cleared by the [place] FDA and  has been authorized for detection and/or diagnosis of SARS-CoV-2 by FDA under an Emergency Use Authorization (EUA).  This EUA will remain in effect (meaning this  test can be used) for the duration of  the COVID-19 declaration under Section 564(b)(1) of the Act, 21 U.S.C. section 360bbb-3(b)(1), unless the authorization is terminated or revoked sooner.     Influenza A by PCR NEGATIVE NEGATIVE Final   Influenza B by PCR NEGATIVE NEGATIVE Final    Comment: (NOTE) The Xpert  Xpress SARS-CoV-2/FLU/RSV plus assay is intended as an aid in the diagnosis of influenza from Nasopharyngeal swab specimens and should not be used as a sole basis for treatment. Nasal washings and aspirates are unacceptable for Xpert Xpress SARS-CoV-2/FLU/RSV testing.  Fact Sheet for Patients: BloggerCourse.comhttps://www.fda.gov/media/152166/download  Fact Sheet for Healthcare Providers: SeriousBroker.ithttps://www.fda.gov/media/152162/download  This test is not yet approved or cleared by the [place] FDA and has been authorized for detection and/or diagnosis of SARS-CoV-2 by FDA under an Emergency Use Authorization (EUA). This EUA will  remain in effect (meaning this test can be used) for the duration of the COVID-19 declaration under Section 564(b)(1) of the Act, 21 U.S.C. section 360bbb-3(b)(1), unless the authorization is terminated or revoked.  Performed at [facility], [address] [place], [place] [address]     Today   Subjective    Carley Hammedracy [place] today has no headache,no chest abdominal pain,no new weakness tingling or numbness, feels much better wants to go home today.     Objective   Blood pressure 124/79, pulse 97, temperature 98 F (36.7 C), temperature source Oral, resp. rate 20, height 5\' 10"  (1.778 m), weight (!) 158.8 kg, SpO2 90 %.   Intake/Output Summary (Last 24 hours) at 10/26/2020 1206 Last data filed at 10/26/2020 0910 Gross per 24 hour  Intake 940 ml  Output 800 ml  Net 140 ml    Exam  Awake Alert, No new F.N deficits, Normal affect [place].AT,PERRAL Supple Neck,No JVD, No cervical lymphadenopathy appriciated.  Symmetrical Chest wall movement, Good air movement bilaterally, CTAB RRR,No Gallops,Rubs or new Murmurs, No Parasternal Heave +ve B.Sounds, Abd Soft, Non tender, No organomegaly appriciated, No rebound -guarding or rigidity. No Cyanosis, Clubbing or edema, No new Rash or bruise   Data Review   CBC w Diff:  Lab Results  Component Value Date   WBC 10.5 10/26/2020   HGB 12.6 (L) 10/26/2020   HCT 39.8 10/26/2020   PLT 389 10/26/2020   LYMPHOPCT 10 10/26/2020   MONOPCT 5 10/26/2020   EOSPCT 0 10/26/2020   BASOPCT 0 10/26/2020    CMP:  Lab Results  Component Value Date   NA 136 10/26/2020   K 4.5 10/26/2020   CL 100 10/26/2020   CO2 23 10/26/2020   BUN 16 10/26/2020   CREATININE 1.00 10/26/2020   PROT 6.1 (L) 10/26/2020   ALBUMIN 2.4 (L) 10/26/2020   BILITOT 0.4 10/26/2020   ALKPHOS 54 10/26/2020   AST 41 10/26/2020   ALT 50 (H) 10/26/2020  .   Total Time in preparing paper work, data evaluation and todays exam - 35 minutes  Susa RaringPrashant Winson Eichorn M.D on  10/26/2020 at 12:06 PM  [facility]

## 2020-10-26 NOTE — TOC Progression Note (Signed)
Transition of Care [facility]) - Progression Note    Patient Details  Name: Troy Diaz MRN: [id number] Date of Birth: 1971/05/01  [facility] CM/SW Contact  Beckie Busing, RN Phone Number: [phone]  10/26/2020, 11:20 AM  Clinical Narrative:    [facility] consulted for patient with Home O2 needs. O2 order has been called to [employer]. O2 to be delivered to the unit.   Expected Discharge Plan: Home/Self Care Barriers to Discharge: Continued Medical Work up  Expected Discharge Plan and Services Expected Discharge Plan: Home/Self Care   Discharge Planning Services: CM Consult   Living arrangements for the past 2 months: Single Family Home Expected Discharge Date: 10/26/20                                     Social Determinants of Health (SDOH) Interventions    Readmission Risk Interventions No flowsheet data found.

## 2020-10-26 NOTE — Progress Notes (Signed)
Patient was discharged home by MD order; discharged instructions  review and give to patient with care notes; IV DIC; skin intact; patient is waiting for oxygen tank to be delivered to his room by [employer] per CM; patient will be escorted to the car by nurse tech via wheelchair.

## 2022-07-28 IMAGING — DX DG CHEST 1V PORT
1 series · 1 of 1 positions shown · non-contrast
Comparison: None.

CLINICAL DATA: COVID positive.  Short of breath

EXAM:
PORTABLE CHEST 1 VIEW

[chest ap]
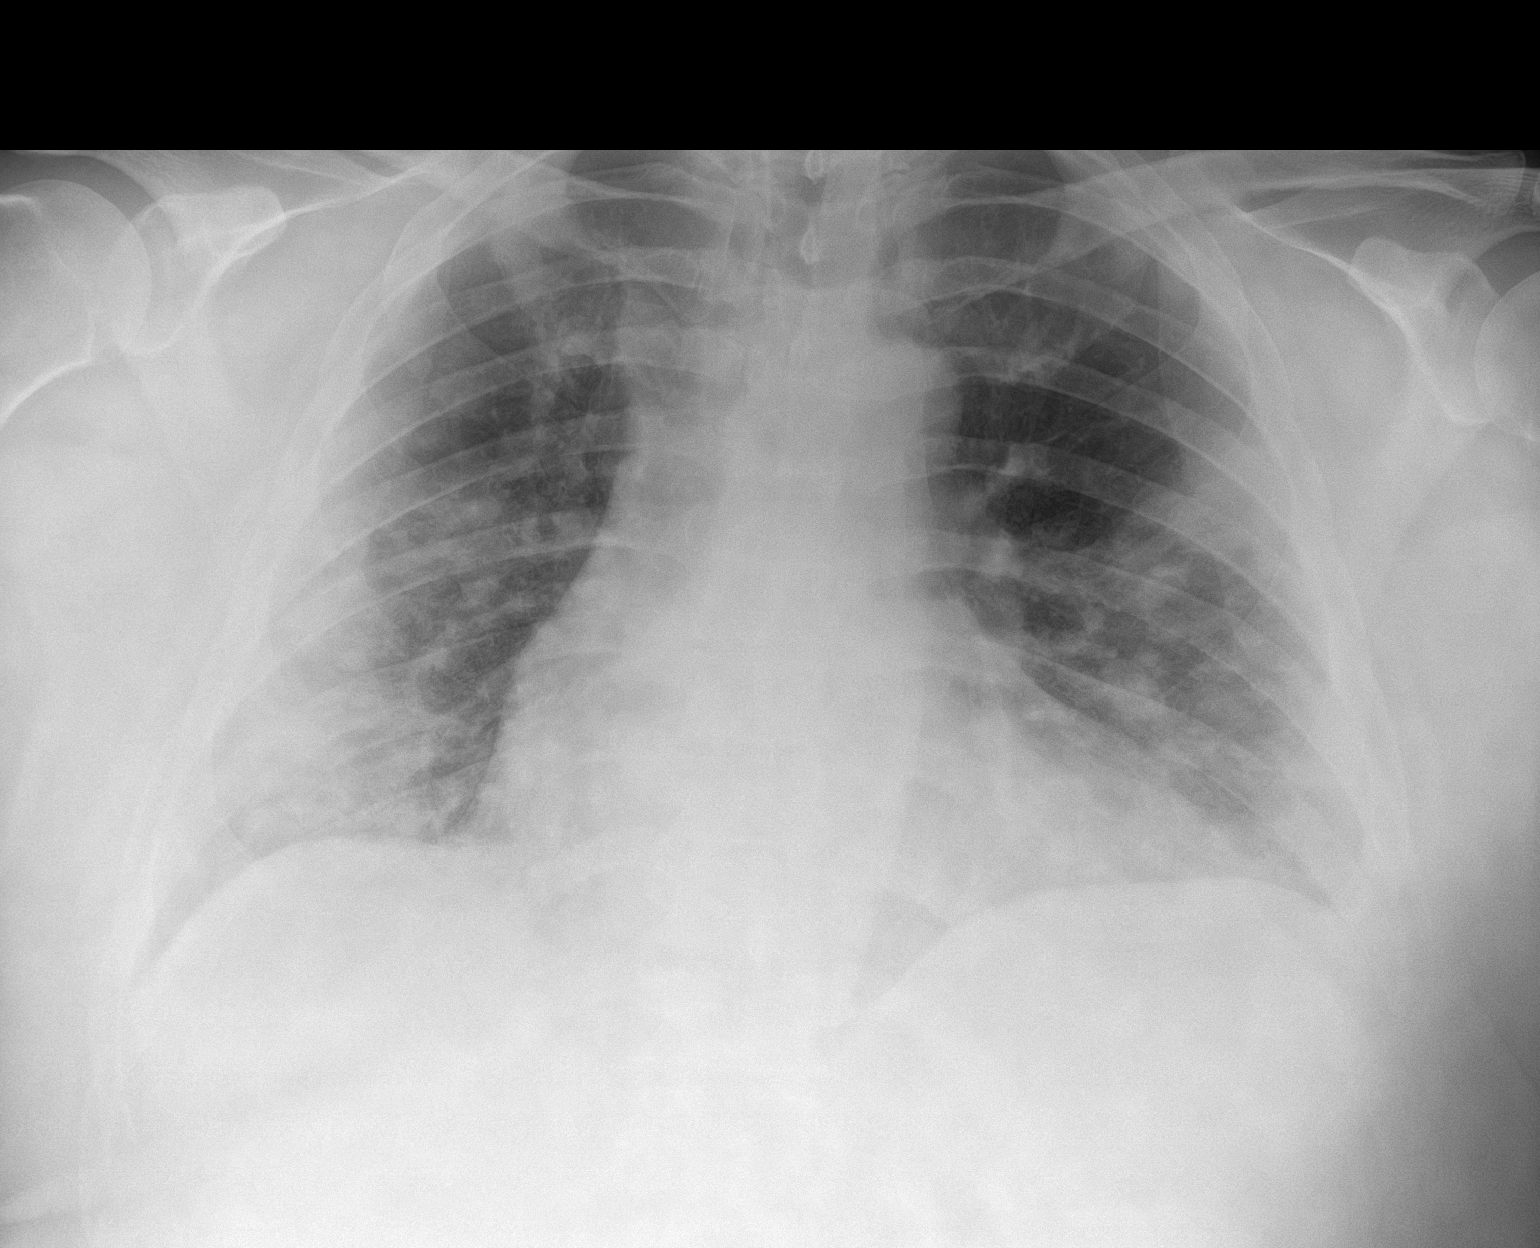

[1 of 1 positions shown; findings below may reference images not displayed]

FINDINGS: Diffuse bilateral patchy infiltrates throughout both lungs
predominately in the bases. No effusion. Heart size mildly enlarged.
No vascular congestion.
IMPRESSION: Extensive bibasilar airspace disease compatible with COVID
pneumonia.
